# Patient Record
Sex: Male | Born: 1975 | Race: Black or African American | Hispanic: No | Marital: Single | Smoking: Never smoker
Health system: Southern US, Community
[De-identification: ages and names within clinical notes are randomized; demographics above are authoritative.]

## PROBLEM LIST (undated history)

## (undated) DIAGNOSIS — G35 Multiple sclerosis: Secondary | ICD-10-CM

## (undated) DIAGNOSIS — R519 Headache, unspecified: Secondary | ICD-10-CM

## (undated) DIAGNOSIS — N529 Male erectile dysfunction, unspecified: Secondary | ICD-10-CM

## (undated) DIAGNOSIS — E2740 Unspecified adrenocortical insufficiency: Secondary | ICD-10-CM

## (undated) DIAGNOSIS — F32A Depression, unspecified: Secondary | ICD-10-CM

## (undated) DIAGNOSIS — E559 Vitamin D deficiency, unspecified: Secondary | ICD-10-CM

## (undated) HISTORY — DX: Headache, unspecified: R51.9

## (undated) HISTORY — DX: Unspecified adrenocortical insufficiency: E27.40

## (undated) HISTORY — DX: Male erectile dysfunction, unspecified: N52.9

## (undated) HISTORY — DX: Vitamin D deficiency, unspecified: E55.9

## (undated) HISTORY — DX: Depression, unspecified: F32.A

---

## 1997-12-07 ENCOUNTER — Encounter: Admission: RE | Admit: 1997-12-07 | Discharge: 1998-03-07 | Payer: Self-pay | Admitting: Neurology

## 1998-04-03 ENCOUNTER — Inpatient Hospital Stay (HOSPITAL_COMMUNITY): Admission: EM | Admit: 1998-04-03 | Discharge: 1998-04-10 | Payer: Self-pay | Admitting: Emergency Medicine

## 1998-04-16 ENCOUNTER — Encounter: Admission: RE | Admit: 1998-04-16 | Discharge: 1998-07-15 | Payer: Self-pay | Admitting: Pediatrics

## 1999-03-19 ENCOUNTER — Encounter: Admission: RE | Admit: 1999-03-19 | Discharge: 1999-06-17 | Payer: Self-pay

## 1999-09-17 ENCOUNTER — Encounter: Admission: RE | Admit: 1999-09-17 | Discharge: 1999-10-22 | Payer: Self-pay

## 2009-08-15 ENCOUNTER — Ambulatory Visit (HOSPITAL_COMMUNITY): Admission: RE | Admit: 2009-08-15 | Discharge: 2009-08-15 | Payer: Self-pay | Admitting: Psychiatry

## 2010-09-29 ENCOUNTER — Encounter: Payer: Self-pay | Admitting: Otolaryngology

## 2011-12-16 ENCOUNTER — Other Ambulatory Visit: Payer: Self-pay | Admitting: Psychiatry

## 2011-12-16 DIAGNOSIS — G35 Multiple sclerosis: Secondary | ICD-10-CM

## 2011-12-22 ENCOUNTER — Ambulatory Visit
Admission: RE | Admit: 2011-12-22 | Discharge: 2011-12-22 | Disposition: A | Discharge status: Home / Self Care | Payer: Medicare Other | Source: Ambulatory Visit | Attending: Psychiatry | Admitting: Psychiatry

## 2011-12-22 DIAGNOSIS — G35 Multiple sclerosis: Secondary | ICD-10-CM

## 2011-12-22 DIAGNOSIS — G35D Multiple sclerosis, unspecified: Secondary | ICD-10-CM

## 2011-12-22 MED ORDER — GADOBENATE DIMEGLUMINE 529 MG/ML IV SOLN
15.0000 mL | Freq: Once | INTRAVENOUS | Status: AC | PRN
  Administered 2011-12-22: 15 mL via INTRAVENOUS

## 2013-02-28 ENCOUNTER — Other Ambulatory Visit (HOSPITAL_COMMUNITY): Payer: Self-pay | Admitting: Specialist

## 2013-02-28 DIAGNOSIS — G35 Multiple sclerosis: Secondary | ICD-10-CM

## 2013-03-03 ENCOUNTER — Ambulatory Visit (HOSPITAL_COMMUNITY)
Admission: RE | Admit: 2013-03-03 | Discharge: 2013-03-03 | Disposition: A | Discharge status: Home / Self Care | Payer: Medicare Other | Source: Ambulatory Visit | Attending: Specialist | Admitting: Specialist

## 2013-03-03 DIAGNOSIS — G35 Multiple sclerosis: Secondary | ICD-10-CM

## 2013-03-03 MED ORDER — GADOBENATE DIMEGLUMINE 529 MG/ML IV SOLN
15.0000 mL | Freq: Once | INTRAVENOUS | Status: AC
  Administered 2013-03-03: 15 mL via INTRAVENOUS

## 2013-07-20 ENCOUNTER — Other Ambulatory Visit: Payer: Self-pay | Admitting: Gastroenterology

## 2014-10-09 DIAGNOSIS — G35 Multiple sclerosis: Secondary | ICD-10-CM | POA: Diagnosis not present

## 2014-10-10 DIAGNOSIS — G35 Multiple sclerosis: Secondary | ICD-10-CM | POA: Diagnosis not present

## 2014-10-11 DIAGNOSIS — G35 Multiple sclerosis: Secondary | ICD-10-CM | POA: Diagnosis not present

## 2014-10-12 DIAGNOSIS — G35 Multiple sclerosis: Secondary | ICD-10-CM | POA: Diagnosis not present

## 2014-10-13 DIAGNOSIS — M62838 Other muscle spasm: Secondary | ICD-10-CM | POA: Diagnosis not present

## 2014-10-13 DIAGNOSIS — R262 Difficulty in walking, not elsewhere classified: Secondary | ICD-10-CM | POA: Diagnosis not present

## 2014-10-13 DIAGNOSIS — M6281 Muscle weakness (generalized): Secondary | ICD-10-CM | POA: Diagnosis not present

## 2014-10-13 DIAGNOSIS — G35 Multiple sclerosis: Secondary | ICD-10-CM | POA: Diagnosis not present

## 2014-10-14 DIAGNOSIS — G35 Multiple sclerosis: Secondary | ICD-10-CM | POA: Diagnosis not present

## 2014-10-16 DIAGNOSIS — G35 Multiple sclerosis: Secondary | ICD-10-CM | POA: Diagnosis not present

## 2014-10-17 DIAGNOSIS — G35 Multiple sclerosis: Secondary | ICD-10-CM | POA: Diagnosis not present

## 2014-10-18 DIAGNOSIS — G35 Multiple sclerosis: Secondary | ICD-10-CM | POA: Diagnosis not present

## 2014-10-19 DIAGNOSIS — G35 Multiple sclerosis: Secondary | ICD-10-CM | POA: Diagnosis not present

## 2014-10-20 DIAGNOSIS — G35 Multiple sclerosis: Secondary | ICD-10-CM | POA: Diagnosis not present

## 2014-10-21 DIAGNOSIS — G35 Multiple sclerosis: Secondary | ICD-10-CM | POA: Diagnosis not present

## 2014-10-23 DIAGNOSIS — G35 Multiple sclerosis: Secondary | ICD-10-CM | POA: Diagnosis not present

## 2014-10-24 DIAGNOSIS — G35 Multiple sclerosis: Secondary | ICD-10-CM | POA: Diagnosis not present

## 2014-10-25 DIAGNOSIS — G35 Multiple sclerosis: Secondary | ICD-10-CM | POA: Diagnosis not present

## 2014-10-26 DIAGNOSIS — G35 Multiple sclerosis: Secondary | ICD-10-CM | POA: Diagnosis not present

## 2014-10-27 DIAGNOSIS — R262 Difficulty in walking, not elsewhere classified: Secondary | ICD-10-CM | POA: Diagnosis not present

## 2014-10-27 DIAGNOSIS — G35 Multiple sclerosis: Secondary | ICD-10-CM | POA: Diagnosis not present

## 2014-10-27 DIAGNOSIS — M6281 Muscle weakness (generalized): Secondary | ICD-10-CM | POA: Diagnosis not present

## 2014-10-27 DIAGNOSIS — M62838 Other muscle spasm: Secondary | ICD-10-CM | POA: Diagnosis not present

## 2014-10-28 DIAGNOSIS — G35 Multiple sclerosis: Secondary | ICD-10-CM | POA: Diagnosis not present

## 2014-10-30 DIAGNOSIS — G35 Multiple sclerosis: Secondary | ICD-10-CM | POA: Diagnosis not present

## 2014-10-31 DIAGNOSIS — G35 Multiple sclerosis: Secondary | ICD-10-CM | POA: Diagnosis not present

## 2014-11-01 DIAGNOSIS — G35 Multiple sclerosis: Secondary | ICD-10-CM | POA: Diagnosis not present

## 2014-11-02 DIAGNOSIS — G35 Multiple sclerosis: Secondary | ICD-10-CM | POA: Diagnosis not present

## 2014-11-03 DIAGNOSIS — G35 Multiple sclerosis: Secondary | ICD-10-CM | POA: Diagnosis not present

## 2014-11-06 DIAGNOSIS — R5383 Other fatigue: Secondary | ICD-10-CM | POA: Diagnosis not present

## 2014-11-06 DIAGNOSIS — G35 Multiple sclerosis: Secondary | ICD-10-CM | POA: Diagnosis not present

## 2014-11-06 DIAGNOSIS — G959 Disease of spinal cord, unspecified: Secondary | ICD-10-CM | POA: Diagnosis not present

## 2014-11-15 ENCOUNTER — Other Ambulatory Visit: Payer: Self-pay | Admitting: Specialist

## 2014-11-15 DIAGNOSIS — G35 Multiple sclerosis: Secondary | ICD-10-CM | POA: Diagnosis not present

## 2014-11-24 DIAGNOSIS — I10 Essential (primary) hypertension: Secondary | ICD-10-CM | POA: Diagnosis not present

## 2014-11-25 DIAGNOSIS — I10 Essential (primary) hypertension: Secondary | ICD-10-CM | POA: Diagnosis not present

## 2014-11-27 DIAGNOSIS — I10 Essential (primary) hypertension: Secondary | ICD-10-CM | POA: Diagnosis not present

## 2014-11-28 DIAGNOSIS — I10 Essential (primary) hypertension: Secondary | ICD-10-CM | POA: Diagnosis not present

## 2014-11-29 DIAGNOSIS — I10 Essential (primary) hypertension: Secondary | ICD-10-CM | POA: Diagnosis not present

## 2014-11-30 DIAGNOSIS — I10 Essential (primary) hypertension: Secondary | ICD-10-CM | POA: Diagnosis not present

## 2014-12-01 DIAGNOSIS — I10 Essential (primary) hypertension: Secondary | ICD-10-CM | POA: Diagnosis not present

## 2014-12-02 DIAGNOSIS — I10 Essential (primary) hypertension: Secondary | ICD-10-CM | POA: Diagnosis not present

## 2014-12-04 DIAGNOSIS — I10 Essential (primary) hypertension: Secondary | ICD-10-CM | POA: Diagnosis not present

## 2014-12-05 DIAGNOSIS — I10 Essential (primary) hypertension: Secondary | ICD-10-CM | POA: Diagnosis not present

## 2014-12-06 ENCOUNTER — Ambulatory Visit
Admission: RE | Admit: 2014-12-06 | Discharge: 2014-12-06 | Disposition: A | Discharge status: Home / Self Care | Payer: Commercial Managed Care - HMO | Source: Ambulatory Visit | Attending: Specialist | Admitting: Specialist

## 2014-12-06 DIAGNOSIS — M5031 Other cervical disc degeneration,  high cervical region: Secondary | ICD-10-CM | POA: Diagnosis not present

## 2014-12-06 DIAGNOSIS — M4802 Spinal stenosis, cervical region: Secondary | ICD-10-CM | POA: Diagnosis not present

## 2014-12-06 DIAGNOSIS — G35 Multiple sclerosis: Secondary | ICD-10-CM | POA: Diagnosis not present

## 2014-12-06 DIAGNOSIS — I10 Essential (primary) hypertension: Secondary | ICD-10-CM | POA: Diagnosis not present

## 2014-12-06 MED ORDER — GADOBENATE DIMEGLUMINE 529 MG/ML IV SOLN
15.0000 mL | Freq: Once | INTRAVENOUS | Status: AC | PRN
  Administered 2014-12-06: 15 mL via INTRAVENOUS

## 2014-12-07 DIAGNOSIS — I10 Essential (primary) hypertension: Secondary | ICD-10-CM | POA: Diagnosis not present

## 2014-12-08 DIAGNOSIS — I10 Essential (primary) hypertension: Secondary | ICD-10-CM | POA: Diagnosis not present

## 2014-12-09 DIAGNOSIS — I10 Essential (primary) hypertension: Secondary | ICD-10-CM | POA: Diagnosis not present

## 2014-12-11 DIAGNOSIS — I10 Essential (primary) hypertension: Secondary | ICD-10-CM | POA: Diagnosis not present

## 2014-12-12 DIAGNOSIS — I10 Essential (primary) hypertension: Secondary | ICD-10-CM | POA: Diagnosis not present

## 2014-12-13 DIAGNOSIS — I10 Essential (primary) hypertension: Secondary | ICD-10-CM | POA: Diagnosis not present

## 2014-12-14 DIAGNOSIS — I10 Essential (primary) hypertension: Secondary | ICD-10-CM | POA: Diagnosis not present

## 2014-12-15 DIAGNOSIS — I10 Essential (primary) hypertension: Secondary | ICD-10-CM | POA: Diagnosis not present

## 2014-12-16 DIAGNOSIS — I10 Essential (primary) hypertension: Secondary | ICD-10-CM | POA: Diagnosis not present

## 2014-12-18 DIAGNOSIS — I10 Essential (primary) hypertension: Secondary | ICD-10-CM | POA: Diagnosis not present

## 2014-12-19 DIAGNOSIS — I10 Essential (primary) hypertension: Secondary | ICD-10-CM | POA: Diagnosis not present

## 2014-12-20 DIAGNOSIS — I10 Essential (primary) hypertension: Secondary | ICD-10-CM | POA: Diagnosis not present

## 2014-12-21 DIAGNOSIS — I10 Essential (primary) hypertension: Secondary | ICD-10-CM | POA: Diagnosis not present

## 2014-12-22 DIAGNOSIS — I10 Essential (primary) hypertension: Secondary | ICD-10-CM | POA: Diagnosis not present

## 2014-12-23 DIAGNOSIS — I10 Essential (primary) hypertension: Secondary | ICD-10-CM | POA: Diagnosis not present

## 2014-12-25 DIAGNOSIS — I10 Essential (primary) hypertension: Secondary | ICD-10-CM | POA: Diagnosis not present

## 2014-12-26 DIAGNOSIS — I10 Essential (primary) hypertension: Secondary | ICD-10-CM | POA: Diagnosis not present

## 2014-12-27 DIAGNOSIS — I10 Essential (primary) hypertension: Secondary | ICD-10-CM | POA: Diagnosis not present

## 2014-12-28 DIAGNOSIS — I10 Essential (primary) hypertension: Secondary | ICD-10-CM | POA: Diagnosis not present

## 2014-12-29 DIAGNOSIS — I10 Essential (primary) hypertension: Secondary | ICD-10-CM | POA: Diagnosis not present

## 2014-12-30 DIAGNOSIS — I10 Essential (primary) hypertension: Secondary | ICD-10-CM | POA: Diagnosis not present

## 2015-01-01 DIAGNOSIS — I10 Essential (primary) hypertension: Secondary | ICD-10-CM | POA: Diagnosis not present

## 2015-01-02 DIAGNOSIS — I10 Essential (primary) hypertension: Secondary | ICD-10-CM | POA: Diagnosis not present

## 2015-01-03 DIAGNOSIS — I10 Essential (primary) hypertension: Secondary | ICD-10-CM | POA: Diagnosis not present

## 2015-01-04 DIAGNOSIS — I10 Essential (primary) hypertension: Secondary | ICD-10-CM | POA: Diagnosis not present

## 2015-01-05 DIAGNOSIS — I10 Essential (primary) hypertension: Secondary | ICD-10-CM | POA: Diagnosis not present

## 2015-01-06 DIAGNOSIS — I10 Essential (primary) hypertension: Secondary | ICD-10-CM | POA: Diagnosis not present

## 2015-01-08 DIAGNOSIS — I10 Essential (primary) hypertension: Secondary | ICD-10-CM | POA: Diagnosis not present

## 2015-01-09 DIAGNOSIS — I10 Essential (primary) hypertension: Secondary | ICD-10-CM | POA: Diagnosis not present

## 2015-01-10 DIAGNOSIS — I10 Essential (primary) hypertension: Secondary | ICD-10-CM | POA: Diagnosis not present

## 2015-01-11 DIAGNOSIS — I10 Essential (primary) hypertension: Secondary | ICD-10-CM | POA: Diagnosis not present

## 2015-01-12 DIAGNOSIS — R262 Difficulty in walking, not elsewhere classified: Secondary | ICD-10-CM | POA: Diagnosis not present

## 2015-01-12 DIAGNOSIS — I10 Essential (primary) hypertension: Secondary | ICD-10-CM | POA: Diagnosis not present

## 2015-01-12 DIAGNOSIS — M62838 Other muscle spasm: Secondary | ICD-10-CM | POA: Diagnosis not present

## 2015-01-12 DIAGNOSIS — M6281 Muscle weakness (generalized): Secondary | ICD-10-CM | POA: Diagnosis not present

## 2015-01-12 DIAGNOSIS — G35 Multiple sclerosis: Secondary | ICD-10-CM | POA: Diagnosis not present

## 2015-01-13 DIAGNOSIS — I10 Essential (primary) hypertension: Secondary | ICD-10-CM | POA: Diagnosis not present

## 2015-01-16 DIAGNOSIS — I10 Essential (primary) hypertension: Secondary | ICD-10-CM | POA: Diagnosis not present

## 2015-01-17 DIAGNOSIS — I10 Essential (primary) hypertension: Secondary | ICD-10-CM | POA: Diagnosis not present

## 2015-01-18 DIAGNOSIS — I10 Essential (primary) hypertension: Secondary | ICD-10-CM | POA: Diagnosis not present

## 2015-01-19 DIAGNOSIS — G35 Multiple sclerosis: Secondary | ICD-10-CM | POA: Diagnosis not present

## 2015-01-19 DIAGNOSIS — M6281 Muscle weakness (generalized): Secondary | ICD-10-CM | POA: Diagnosis not present

## 2015-01-19 DIAGNOSIS — M62838 Other muscle spasm: Secondary | ICD-10-CM | POA: Diagnosis not present

## 2015-01-19 DIAGNOSIS — I10 Essential (primary) hypertension: Secondary | ICD-10-CM | POA: Diagnosis not present

## 2015-01-19 DIAGNOSIS — R262 Difficulty in walking, not elsewhere classified: Secondary | ICD-10-CM | POA: Diagnosis not present

## 2015-01-20 DIAGNOSIS — I10 Essential (primary) hypertension: Secondary | ICD-10-CM | POA: Diagnosis not present

## 2015-01-22 DIAGNOSIS — I10 Essential (primary) hypertension: Secondary | ICD-10-CM | POA: Diagnosis not present

## 2015-01-23 DIAGNOSIS — I10 Essential (primary) hypertension: Secondary | ICD-10-CM | POA: Diagnosis not present

## 2015-01-24 DIAGNOSIS — I10 Essential (primary) hypertension: Secondary | ICD-10-CM | POA: Diagnosis not present

## 2015-01-25 DIAGNOSIS — I10 Essential (primary) hypertension: Secondary | ICD-10-CM | POA: Diagnosis not present

## 2015-01-26 DIAGNOSIS — G35 Multiple sclerosis: Secondary | ICD-10-CM | POA: Diagnosis not present

## 2015-01-26 DIAGNOSIS — M6281 Muscle weakness (generalized): Secondary | ICD-10-CM | POA: Diagnosis not present

## 2015-01-26 DIAGNOSIS — R262 Difficulty in walking, not elsewhere classified: Secondary | ICD-10-CM | POA: Diagnosis not present

## 2015-01-26 DIAGNOSIS — M62838 Other muscle spasm: Secondary | ICD-10-CM | POA: Diagnosis not present

## 2015-01-26 DIAGNOSIS — I10 Essential (primary) hypertension: Secondary | ICD-10-CM | POA: Diagnosis not present

## 2015-01-27 DIAGNOSIS — I10 Essential (primary) hypertension: Secondary | ICD-10-CM | POA: Diagnosis not present

## 2015-01-29 DIAGNOSIS — I10 Essential (primary) hypertension: Secondary | ICD-10-CM | POA: Diagnosis not present

## 2015-01-30 DIAGNOSIS — I10 Essential (primary) hypertension: Secondary | ICD-10-CM | POA: Diagnosis not present

## 2015-01-31 DIAGNOSIS — I10 Essential (primary) hypertension: Secondary | ICD-10-CM | POA: Diagnosis not present

## 2015-02-01 DIAGNOSIS — I10 Essential (primary) hypertension: Secondary | ICD-10-CM | POA: Diagnosis not present

## 2015-02-02 DIAGNOSIS — I10 Essential (primary) hypertension: Secondary | ICD-10-CM | POA: Diagnosis not present

## 2015-02-03 DIAGNOSIS — I10 Essential (primary) hypertension: Secondary | ICD-10-CM | POA: Diagnosis not present

## 2015-02-05 DIAGNOSIS — I10 Essential (primary) hypertension: Secondary | ICD-10-CM | POA: Diagnosis not present

## 2015-02-06 DIAGNOSIS — G959 Disease of spinal cord, unspecified: Secondary | ICD-10-CM | POA: Diagnosis not present

## 2015-02-06 DIAGNOSIS — D72828 Other elevated white blood cell count: Secondary | ICD-10-CM | POA: Diagnosis not present

## 2015-02-06 DIAGNOSIS — G35 Multiple sclerosis: Secondary | ICD-10-CM | POA: Diagnosis not present

## 2015-02-06 DIAGNOSIS — D899 Disorder involving the immune mechanism, unspecified: Secondary | ICD-10-CM | POA: Diagnosis not present

## 2015-02-06 DIAGNOSIS — I10 Essential (primary) hypertension: Secondary | ICD-10-CM | POA: Diagnosis not present

## 2015-02-06 DIAGNOSIS — R5383 Other fatigue: Secondary | ICD-10-CM | POA: Diagnosis not present

## 2015-02-07 DIAGNOSIS — I10 Essential (primary) hypertension: Secondary | ICD-10-CM | POA: Diagnosis not present

## 2015-02-08 DIAGNOSIS — I10 Essential (primary) hypertension: Secondary | ICD-10-CM | POA: Diagnosis not present

## 2015-02-09 DIAGNOSIS — I10 Essential (primary) hypertension: Secondary | ICD-10-CM | POA: Diagnosis not present

## 2015-02-10 DIAGNOSIS — I10 Essential (primary) hypertension: Secondary | ICD-10-CM | POA: Diagnosis not present

## 2015-02-12 DIAGNOSIS — I10 Essential (primary) hypertension: Secondary | ICD-10-CM | POA: Diagnosis not present

## 2015-02-13 DIAGNOSIS — I10 Essential (primary) hypertension: Secondary | ICD-10-CM | POA: Diagnosis not present

## 2015-02-14 DIAGNOSIS — I10 Essential (primary) hypertension: Secondary | ICD-10-CM | POA: Diagnosis not present

## 2015-02-15 DIAGNOSIS — I10 Essential (primary) hypertension: Secondary | ICD-10-CM | POA: Diagnosis not present

## 2015-02-16 DIAGNOSIS — I10 Essential (primary) hypertension: Secondary | ICD-10-CM | POA: Diagnosis not present

## 2015-02-17 DIAGNOSIS — I10 Essential (primary) hypertension: Secondary | ICD-10-CM | POA: Diagnosis not present

## 2015-02-19 DIAGNOSIS — I10 Essential (primary) hypertension: Secondary | ICD-10-CM | POA: Diagnosis not present

## 2015-02-20 DIAGNOSIS — I10 Essential (primary) hypertension: Secondary | ICD-10-CM | POA: Diagnosis not present

## 2015-02-21 DIAGNOSIS — I10 Essential (primary) hypertension: Secondary | ICD-10-CM | POA: Diagnosis not present

## 2015-02-22 DIAGNOSIS — I10 Essential (primary) hypertension: Secondary | ICD-10-CM | POA: Diagnosis not present

## 2015-02-23 DIAGNOSIS — I10 Essential (primary) hypertension: Secondary | ICD-10-CM | POA: Diagnosis not present

## 2015-02-24 DIAGNOSIS — I10 Essential (primary) hypertension: Secondary | ICD-10-CM | POA: Diagnosis not present

## 2015-02-26 DIAGNOSIS — I10 Essential (primary) hypertension: Secondary | ICD-10-CM | POA: Diagnosis not present

## 2015-02-27 DIAGNOSIS — I10 Essential (primary) hypertension: Secondary | ICD-10-CM | POA: Diagnosis not present

## 2015-02-28 DIAGNOSIS — I10 Essential (primary) hypertension: Secondary | ICD-10-CM | POA: Diagnosis not present

## 2015-03-01 DIAGNOSIS — I10 Essential (primary) hypertension: Secondary | ICD-10-CM | POA: Diagnosis not present

## 2015-03-02 DIAGNOSIS — I10 Essential (primary) hypertension: Secondary | ICD-10-CM | POA: Diagnosis not present

## 2015-03-03 DIAGNOSIS — I10 Essential (primary) hypertension: Secondary | ICD-10-CM | POA: Diagnosis not present

## 2015-03-05 DIAGNOSIS — I10 Essential (primary) hypertension: Secondary | ICD-10-CM | POA: Diagnosis not present

## 2015-03-06 DIAGNOSIS — I10 Essential (primary) hypertension: Secondary | ICD-10-CM | POA: Diagnosis not present

## 2015-03-07 DIAGNOSIS — I10 Essential (primary) hypertension: Secondary | ICD-10-CM | POA: Diagnosis not present

## 2015-03-08 DIAGNOSIS — I10 Essential (primary) hypertension: Secondary | ICD-10-CM | POA: Diagnosis not present

## 2015-03-09 DIAGNOSIS — I10 Essential (primary) hypertension: Secondary | ICD-10-CM | POA: Diagnosis not present

## 2015-03-10 DIAGNOSIS — I10 Essential (primary) hypertension: Secondary | ICD-10-CM | POA: Diagnosis not present

## 2015-03-12 DIAGNOSIS — I10 Essential (primary) hypertension: Secondary | ICD-10-CM | POA: Diagnosis not present

## 2015-03-13 DIAGNOSIS — I10 Essential (primary) hypertension: Secondary | ICD-10-CM | POA: Diagnosis not present

## 2015-03-14 DIAGNOSIS — I10 Essential (primary) hypertension: Secondary | ICD-10-CM | POA: Diagnosis not present

## 2015-03-15 DIAGNOSIS — I10 Essential (primary) hypertension: Secondary | ICD-10-CM | POA: Diagnosis not present

## 2015-03-16 DIAGNOSIS — I10 Essential (primary) hypertension: Secondary | ICD-10-CM | POA: Diagnosis not present

## 2015-03-17 DIAGNOSIS — I10 Essential (primary) hypertension: Secondary | ICD-10-CM | POA: Diagnosis not present

## 2015-03-19 DIAGNOSIS — I10 Essential (primary) hypertension: Secondary | ICD-10-CM | POA: Diagnosis not present

## 2015-03-20 DIAGNOSIS — I10 Essential (primary) hypertension: Secondary | ICD-10-CM | POA: Diagnosis not present

## 2015-03-20 DIAGNOSIS — G35 Multiple sclerosis: Secondary | ICD-10-CM | POA: Diagnosis not present

## 2015-03-21 DIAGNOSIS — G35 Multiple sclerosis: Secondary | ICD-10-CM | POA: Diagnosis not present

## 2015-03-21 DIAGNOSIS — I10 Essential (primary) hypertension: Secondary | ICD-10-CM | POA: Diagnosis not present

## 2015-03-22 DIAGNOSIS — G35 Multiple sclerosis: Secondary | ICD-10-CM | POA: Diagnosis not present

## 2015-03-22 DIAGNOSIS — I10 Essential (primary) hypertension: Secondary | ICD-10-CM | POA: Diagnosis not present

## 2015-03-23 DIAGNOSIS — G35 Multiple sclerosis: Secondary | ICD-10-CM | POA: Diagnosis not present

## 2015-03-23 DIAGNOSIS — I10 Essential (primary) hypertension: Secondary | ICD-10-CM | POA: Diagnosis not present

## 2015-03-24 DIAGNOSIS — I10 Essential (primary) hypertension: Secondary | ICD-10-CM | POA: Diagnosis not present

## 2015-03-24 DIAGNOSIS — G35 Multiple sclerosis: Secondary | ICD-10-CM | POA: Diagnosis not present

## 2015-03-26 DIAGNOSIS — G35 Multiple sclerosis: Secondary | ICD-10-CM | POA: Diagnosis not present

## 2015-03-26 DIAGNOSIS — I10 Essential (primary) hypertension: Secondary | ICD-10-CM | POA: Diagnosis not present

## 2015-03-27 DIAGNOSIS — I10 Essential (primary) hypertension: Secondary | ICD-10-CM | POA: Diagnosis not present

## 2015-03-27 DIAGNOSIS — G35 Multiple sclerosis: Secondary | ICD-10-CM | POA: Diagnosis not present

## 2015-03-28 DIAGNOSIS — I10 Essential (primary) hypertension: Secondary | ICD-10-CM | POA: Diagnosis not present

## 2015-03-28 DIAGNOSIS — G35 Multiple sclerosis: Secondary | ICD-10-CM | POA: Diagnosis not present

## 2015-03-29 DIAGNOSIS — G35 Multiple sclerosis: Secondary | ICD-10-CM | POA: Diagnosis not present

## 2015-03-29 DIAGNOSIS — I10 Essential (primary) hypertension: Secondary | ICD-10-CM | POA: Diagnosis not present

## 2015-03-30 DIAGNOSIS — G35 Multiple sclerosis: Secondary | ICD-10-CM | POA: Diagnosis not present

## 2015-03-30 DIAGNOSIS — I10 Essential (primary) hypertension: Secondary | ICD-10-CM | POA: Diagnosis not present

## 2015-03-31 DIAGNOSIS — I10 Essential (primary) hypertension: Secondary | ICD-10-CM | POA: Diagnosis not present

## 2015-03-31 DIAGNOSIS — G35 Multiple sclerosis: Secondary | ICD-10-CM | POA: Diagnosis not present

## 2015-04-02 DIAGNOSIS — I10 Essential (primary) hypertension: Secondary | ICD-10-CM | POA: Diagnosis not present

## 2015-04-02 DIAGNOSIS — G35 Multiple sclerosis: Secondary | ICD-10-CM | POA: Diagnosis not present

## 2015-04-03 DIAGNOSIS — I10 Essential (primary) hypertension: Secondary | ICD-10-CM | POA: Diagnosis not present

## 2015-04-03 DIAGNOSIS — G35 Multiple sclerosis: Secondary | ICD-10-CM | POA: Diagnosis not present

## 2015-04-04 DIAGNOSIS — G35 Multiple sclerosis: Secondary | ICD-10-CM | POA: Diagnosis not present

## 2015-04-04 DIAGNOSIS — I10 Essential (primary) hypertension: Secondary | ICD-10-CM | POA: Diagnosis not present

## 2015-04-05 DIAGNOSIS — G35 Multiple sclerosis: Secondary | ICD-10-CM | POA: Diagnosis not present

## 2015-04-05 DIAGNOSIS — I10 Essential (primary) hypertension: Secondary | ICD-10-CM | POA: Diagnosis not present

## 2015-04-06 DIAGNOSIS — I10 Essential (primary) hypertension: Secondary | ICD-10-CM | POA: Diagnosis not present

## 2015-04-06 DIAGNOSIS — G35 Multiple sclerosis: Secondary | ICD-10-CM | POA: Diagnosis not present

## 2015-04-07 DIAGNOSIS — G35 Multiple sclerosis: Secondary | ICD-10-CM | POA: Diagnosis not present

## 2015-04-07 DIAGNOSIS — I10 Essential (primary) hypertension: Secondary | ICD-10-CM | POA: Diagnosis not present

## 2015-04-09 DIAGNOSIS — G35 Multiple sclerosis: Secondary | ICD-10-CM | POA: Diagnosis not present

## 2015-04-09 DIAGNOSIS — I10 Essential (primary) hypertension: Secondary | ICD-10-CM | POA: Diagnosis not present

## 2015-04-10 DIAGNOSIS — I10 Essential (primary) hypertension: Secondary | ICD-10-CM | POA: Diagnosis not present

## 2015-04-10 DIAGNOSIS — G35 Multiple sclerosis: Secondary | ICD-10-CM | POA: Diagnosis not present

## 2015-04-11 DIAGNOSIS — I10 Essential (primary) hypertension: Secondary | ICD-10-CM | POA: Diagnosis not present

## 2015-04-11 DIAGNOSIS — G35 Multiple sclerosis: Secondary | ICD-10-CM | POA: Diagnosis not present

## 2015-04-12 DIAGNOSIS — I10 Essential (primary) hypertension: Secondary | ICD-10-CM | POA: Diagnosis not present

## 2015-04-12 DIAGNOSIS — G35 Multiple sclerosis: Secondary | ICD-10-CM | POA: Diagnosis not present

## 2015-04-13 DIAGNOSIS — I10 Essential (primary) hypertension: Secondary | ICD-10-CM | POA: Diagnosis not present

## 2015-04-13 DIAGNOSIS — G35 Multiple sclerosis: Secondary | ICD-10-CM | POA: Diagnosis not present

## 2015-04-14 DIAGNOSIS — G35 Multiple sclerosis: Secondary | ICD-10-CM | POA: Diagnosis not present

## 2015-04-14 DIAGNOSIS — I10 Essential (primary) hypertension: Secondary | ICD-10-CM | POA: Diagnosis not present

## 2015-04-16 DIAGNOSIS — G35 Multiple sclerosis: Secondary | ICD-10-CM | POA: Diagnosis not present

## 2015-04-16 DIAGNOSIS — I10 Essential (primary) hypertension: Secondary | ICD-10-CM | POA: Diagnosis not present

## 2015-04-17 DIAGNOSIS — G35 Multiple sclerosis: Secondary | ICD-10-CM | POA: Diagnosis not present

## 2015-04-17 DIAGNOSIS — I10 Essential (primary) hypertension: Secondary | ICD-10-CM | POA: Diagnosis not present

## 2015-04-18 DIAGNOSIS — G35 Multiple sclerosis: Secondary | ICD-10-CM | POA: Diagnosis not present

## 2015-04-18 DIAGNOSIS — I10 Essential (primary) hypertension: Secondary | ICD-10-CM | POA: Diagnosis not present

## 2015-04-19 DIAGNOSIS — G35 Multiple sclerosis: Secondary | ICD-10-CM | POA: Diagnosis not present

## 2015-04-19 DIAGNOSIS — I10 Essential (primary) hypertension: Secondary | ICD-10-CM | POA: Diagnosis not present

## 2015-04-20 DIAGNOSIS — I10 Essential (primary) hypertension: Secondary | ICD-10-CM | POA: Diagnosis not present

## 2015-04-20 DIAGNOSIS — G35 Multiple sclerosis: Secondary | ICD-10-CM | POA: Diagnosis not present

## 2015-04-21 DIAGNOSIS — G35 Multiple sclerosis: Secondary | ICD-10-CM | POA: Diagnosis not present

## 2015-04-21 DIAGNOSIS — I10 Essential (primary) hypertension: Secondary | ICD-10-CM | POA: Diagnosis not present

## 2015-04-23 DIAGNOSIS — G35 Multiple sclerosis: Secondary | ICD-10-CM | POA: Diagnosis not present

## 2015-04-23 DIAGNOSIS — I10 Essential (primary) hypertension: Secondary | ICD-10-CM | POA: Diagnosis not present

## 2015-04-24 DIAGNOSIS — I10 Essential (primary) hypertension: Secondary | ICD-10-CM | POA: Diagnosis not present

## 2015-04-24 DIAGNOSIS — G35 Multiple sclerosis: Secondary | ICD-10-CM | POA: Diagnosis not present

## 2015-04-25 DIAGNOSIS — I10 Essential (primary) hypertension: Secondary | ICD-10-CM | POA: Diagnosis not present

## 2015-04-25 DIAGNOSIS — G35 Multiple sclerosis: Secondary | ICD-10-CM | POA: Diagnosis not present

## 2015-04-26 DIAGNOSIS — G35 Multiple sclerosis: Secondary | ICD-10-CM | POA: Diagnosis not present

## 2015-04-26 DIAGNOSIS — I10 Essential (primary) hypertension: Secondary | ICD-10-CM | POA: Diagnosis not present

## 2015-04-27 DIAGNOSIS — G35 Multiple sclerosis: Secondary | ICD-10-CM | POA: Diagnosis not present

## 2015-04-27 DIAGNOSIS — I10 Essential (primary) hypertension: Secondary | ICD-10-CM | POA: Diagnosis not present

## 2015-04-28 DIAGNOSIS — G35 Multiple sclerosis: Secondary | ICD-10-CM | POA: Diagnosis not present

## 2015-04-28 DIAGNOSIS — I10 Essential (primary) hypertension: Secondary | ICD-10-CM | POA: Diagnosis not present

## 2015-04-30 DIAGNOSIS — I10 Essential (primary) hypertension: Secondary | ICD-10-CM | POA: Diagnosis not present

## 2015-04-30 DIAGNOSIS — G35 Multiple sclerosis: Secondary | ICD-10-CM | POA: Diagnosis not present

## 2015-05-01 DIAGNOSIS — G35 Multiple sclerosis: Secondary | ICD-10-CM | POA: Diagnosis not present

## 2015-05-01 DIAGNOSIS — I10 Essential (primary) hypertension: Secondary | ICD-10-CM | POA: Diagnosis not present

## 2015-05-02 DIAGNOSIS — I10 Essential (primary) hypertension: Secondary | ICD-10-CM | POA: Diagnosis not present

## 2015-05-02 DIAGNOSIS — G35 Multiple sclerosis: Secondary | ICD-10-CM | POA: Diagnosis not present

## 2015-05-03 DIAGNOSIS — G35 Multiple sclerosis: Secondary | ICD-10-CM | POA: Diagnosis not present

## 2015-05-03 DIAGNOSIS — I10 Essential (primary) hypertension: Secondary | ICD-10-CM | POA: Diagnosis not present

## 2015-05-04 DIAGNOSIS — I10 Essential (primary) hypertension: Secondary | ICD-10-CM | POA: Diagnosis not present

## 2015-05-04 DIAGNOSIS — G35 Multiple sclerosis: Secondary | ICD-10-CM | POA: Diagnosis not present

## 2015-05-09 DIAGNOSIS — G35 Multiple sclerosis: Secondary | ICD-10-CM | POA: Diagnosis not present

## 2015-05-09 DIAGNOSIS — G4730 Sleep apnea, unspecified: Secondary | ICD-10-CM | POA: Diagnosis not present

## 2015-05-09 DIAGNOSIS — G959 Disease of spinal cord, unspecified: Secondary | ICD-10-CM | POA: Diagnosis not present

## 2015-05-09 DIAGNOSIS — R5383 Other fatigue: Secondary | ICD-10-CM | POA: Diagnosis not present

## 2015-05-09 DIAGNOSIS — Z79899 Other long term (current) drug therapy: Secondary | ICD-10-CM | POA: Diagnosis not present

## 2015-05-09 DIAGNOSIS — D72828 Other elevated white blood cell count: Secondary | ICD-10-CM | POA: Diagnosis not present

## 2015-05-09 DIAGNOSIS — D899 Disorder involving the immune mechanism, unspecified: Secondary | ICD-10-CM | POA: Diagnosis not present

## 2015-05-10 DIAGNOSIS — G35 Multiple sclerosis: Secondary | ICD-10-CM | POA: Diagnosis not present

## 2015-05-10 DIAGNOSIS — I10 Essential (primary) hypertension: Secondary | ICD-10-CM | POA: Diagnosis not present

## 2015-05-11 DIAGNOSIS — I10 Essential (primary) hypertension: Secondary | ICD-10-CM | POA: Diagnosis not present

## 2015-05-11 DIAGNOSIS — G35 Multiple sclerosis: Secondary | ICD-10-CM | POA: Diagnosis not present

## 2015-05-12 DIAGNOSIS — G35 Multiple sclerosis: Secondary | ICD-10-CM | POA: Diagnosis not present

## 2015-05-12 DIAGNOSIS — I10 Essential (primary) hypertension: Secondary | ICD-10-CM | POA: Diagnosis not present

## 2015-05-14 DIAGNOSIS — I10 Essential (primary) hypertension: Secondary | ICD-10-CM | POA: Diagnosis not present

## 2015-05-14 DIAGNOSIS — G35 Multiple sclerosis: Secondary | ICD-10-CM | POA: Diagnosis not present

## 2015-05-15 DIAGNOSIS — G35 Multiple sclerosis: Secondary | ICD-10-CM | POA: Diagnosis not present

## 2015-05-15 DIAGNOSIS — I10 Essential (primary) hypertension: Secondary | ICD-10-CM | POA: Diagnosis not present

## 2015-05-16 DIAGNOSIS — G35 Multiple sclerosis: Secondary | ICD-10-CM | POA: Diagnosis not present

## 2015-05-16 DIAGNOSIS — I10 Essential (primary) hypertension: Secondary | ICD-10-CM | POA: Diagnosis not present

## 2015-05-17 DIAGNOSIS — G35 Multiple sclerosis: Secondary | ICD-10-CM | POA: Diagnosis not present

## 2015-05-17 DIAGNOSIS — I10 Essential (primary) hypertension: Secondary | ICD-10-CM | POA: Diagnosis not present

## 2015-05-18 DIAGNOSIS — G35 Multiple sclerosis: Secondary | ICD-10-CM | POA: Diagnosis not present

## 2015-05-18 DIAGNOSIS — I10 Essential (primary) hypertension: Secondary | ICD-10-CM | POA: Diagnosis not present

## 2015-05-19 DIAGNOSIS — I10 Essential (primary) hypertension: Secondary | ICD-10-CM | POA: Diagnosis not present

## 2015-05-19 DIAGNOSIS — G35 Multiple sclerosis: Secondary | ICD-10-CM | POA: Diagnosis not present

## 2015-05-21 DIAGNOSIS — I10 Essential (primary) hypertension: Secondary | ICD-10-CM | POA: Diagnosis not present

## 2015-05-21 DIAGNOSIS — G35 Multiple sclerosis: Secondary | ICD-10-CM | POA: Diagnosis not present

## 2015-05-22 DIAGNOSIS — G35 Multiple sclerosis: Secondary | ICD-10-CM | POA: Diagnosis not present

## 2015-05-22 DIAGNOSIS — I10 Essential (primary) hypertension: Secondary | ICD-10-CM | POA: Diagnosis not present

## 2015-05-23 DIAGNOSIS — I10 Essential (primary) hypertension: Secondary | ICD-10-CM | POA: Diagnosis not present

## 2015-05-23 DIAGNOSIS — G35 Multiple sclerosis: Secondary | ICD-10-CM | POA: Diagnosis not present

## 2015-05-24 DIAGNOSIS — I10 Essential (primary) hypertension: Secondary | ICD-10-CM | POA: Diagnosis not present

## 2015-05-24 DIAGNOSIS — G35 Multiple sclerosis: Secondary | ICD-10-CM | POA: Diagnosis not present

## 2015-05-25 DIAGNOSIS — G4730 Sleep apnea, unspecified: Secondary | ICD-10-CM | POA: Diagnosis not present

## 2015-05-25 DIAGNOSIS — G35 Multiple sclerosis: Secondary | ICD-10-CM | POA: Diagnosis not present

## 2015-05-25 DIAGNOSIS — I10 Essential (primary) hypertension: Secondary | ICD-10-CM | POA: Diagnosis not present

## 2015-05-26 DIAGNOSIS — G35 Multiple sclerosis: Secondary | ICD-10-CM | POA: Diagnosis not present

## 2015-05-26 DIAGNOSIS — I10 Essential (primary) hypertension: Secondary | ICD-10-CM | POA: Diagnosis not present

## 2015-05-28 DIAGNOSIS — I10 Essential (primary) hypertension: Secondary | ICD-10-CM | POA: Diagnosis not present

## 2015-05-28 DIAGNOSIS — L0889 Other specified local infections of the skin and subcutaneous tissue: Secondary | ICD-10-CM | POA: Diagnosis not present

## 2015-05-28 DIAGNOSIS — R0781 Pleurodynia: Secondary | ICD-10-CM | POA: Diagnosis not present

## 2015-05-28 DIAGNOSIS — G35 Multiple sclerosis: Secondary | ICD-10-CM | POA: Diagnosis not present

## 2015-05-28 DIAGNOSIS — Z681 Body mass index (BMI) 19 or less, adult: Secondary | ICD-10-CM | POA: Diagnosis not present

## 2015-05-28 DIAGNOSIS — K5900 Constipation, unspecified: Secondary | ICD-10-CM | POA: Diagnosis not present

## 2015-05-29 DIAGNOSIS — G35 Multiple sclerosis: Secondary | ICD-10-CM | POA: Diagnosis not present

## 2015-05-29 DIAGNOSIS — H00022 Hordeolum internum right lower eyelid: Secondary | ICD-10-CM | POA: Diagnosis not present

## 2015-05-29 DIAGNOSIS — I10 Essential (primary) hypertension: Secondary | ICD-10-CM | POA: Diagnosis not present

## 2015-05-30 DIAGNOSIS — G35 Multiple sclerosis: Secondary | ICD-10-CM | POA: Diagnosis not present

## 2015-05-30 DIAGNOSIS — H0012 Chalazion right lower eyelid: Secondary | ICD-10-CM | POA: Diagnosis not present

## 2015-05-30 DIAGNOSIS — I10 Essential (primary) hypertension: Secondary | ICD-10-CM | POA: Diagnosis not present

## 2015-05-31 DIAGNOSIS — G35 Multiple sclerosis: Secondary | ICD-10-CM | POA: Diagnosis not present

## 2015-05-31 DIAGNOSIS — I10 Essential (primary) hypertension: Secondary | ICD-10-CM | POA: Diagnosis not present

## 2015-06-01 DIAGNOSIS — G35 Multiple sclerosis: Secondary | ICD-10-CM | POA: Diagnosis not present

## 2015-06-01 DIAGNOSIS — I10 Essential (primary) hypertension: Secondary | ICD-10-CM | POA: Diagnosis not present

## 2015-06-02 DIAGNOSIS — I10 Essential (primary) hypertension: Secondary | ICD-10-CM | POA: Diagnosis not present

## 2015-06-02 DIAGNOSIS — G35 Multiple sclerosis: Secondary | ICD-10-CM | POA: Diagnosis not present

## 2015-06-11 DIAGNOSIS — G35 Multiple sclerosis: Secondary | ICD-10-CM | POA: Diagnosis not present

## 2015-06-11 DIAGNOSIS — I10 Essential (primary) hypertension: Secondary | ICD-10-CM | POA: Diagnosis not present

## 2015-06-12 DIAGNOSIS — I10 Essential (primary) hypertension: Secondary | ICD-10-CM | POA: Diagnosis not present

## 2015-06-12 DIAGNOSIS — G35 Multiple sclerosis: Secondary | ICD-10-CM | POA: Diagnosis not present

## 2015-06-13 DIAGNOSIS — G35 Multiple sclerosis: Secondary | ICD-10-CM | POA: Diagnosis not present

## 2015-06-13 DIAGNOSIS — I10 Essential (primary) hypertension: Secondary | ICD-10-CM | POA: Diagnosis not present

## 2015-06-14 DIAGNOSIS — I10 Essential (primary) hypertension: Secondary | ICD-10-CM | POA: Diagnosis not present

## 2015-06-14 DIAGNOSIS — G35 Multiple sclerosis: Secondary | ICD-10-CM | POA: Diagnosis not present

## 2015-06-15 DIAGNOSIS — G35 Multiple sclerosis: Secondary | ICD-10-CM | POA: Diagnosis not present

## 2015-06-15 DIAGNOSIS — G4730 Sleep apnea, unspecified: Secondary | ICD-10-CM | POA: Diagnosis not present

## 2015-06-15 DIAGNOSIS — I10 Essential (primary) hypertension: Secondary | ICD-10-CM | POA: Diagnosis not present

## 2015-06-16 DIAGNOSIS — G35 Multiple sclerosis: Secondary | ICD-10-CM | POA: Diagnosis not present

## 2015-06-16 DIAGNOSIS — I10 Essential (primary) hypertension: Secondary | ICD-10-CM | POA: Diagnosis not present

## 2015-06-18 DIAGNOSIS — G35 Multiple sclerosis: Secondary | ICD-10-CM | POA: Diagnosis not present

## 2015-06-18 DIAGNOSIS — I10 Essential (primary) hypertension: Secondary | ICD-10-CM | POA: Diagnosis not present

## 2015-06-19 DIAGNOSIS — G35 Multiple sclerosis: Secondary | ICD-10-CM | POA: Diagnosis not present

## 2015-06-19 DIAGNOSIS — I10 Essential (primary) hypertension: Secondary | ICD-10-CM | POA: Diagnosis not present

## 2015-06-20 DIAGNOSIS — G35 Multiple sclerosis: Secondary | ICD-10-CM | POA: Diagnosis not present

## 2015-06-20 DIAGNOSIS — I10 Essential (primary) hypertension: Secondary | ICD-10-CM | POA: Diagnosis not present

## 2015-06-21 DIAGNOSIS — I10 Essential (primary) hypertension: Secondary | ICD-10-CM | POA: Diagnosis not present

## 2015-06-21 DIAGNOSIS — G35 Multiple sclerosis: Secondary | ICD-10-CM | POA: Diagnosis not present

## 2015-06-22 DIAGNOSIS — G35 Multiple sclerosis: Secondary | ICD-10-CM | POA: Diagnosis not present

## 2015-06-22 DIAGNOSIS — I10 Essential (primary) hypertension: Secondary | ICD-10-CM | POA: Diagnosis not present

## 2015-06-23 DIAGNOSIS — I10 Essential (primary) hypertension: Secondary | ICD-10-CM | POA: Diagnosis not present

## 2015-06-23 DIAGNOSIS — G35 Multiple sclerosis: Secondary | ICD-10-CM | POA: Diagnosis not present

## 2015-06-25 DIAGNOSIS — G35 Multiple sclerosis: Secondary | ICD-10-CM | POA: Diagnosis not present

## 2015-06-25 DIAGNOSIS — I10 Essential (primary) hypertension: Secondary | ICD-10-CM | POA: Diagnosis not present

## 2015-06-26 DIAGNOSIS — I10 Essential (primary) hypertension: Secondary | ICD-10-CM | POA: Diagnosis not present

## 2015-06-26 DIAGNOSIS — G35 Multiple sclerosis: Secondary | ICD-10-CM | POA: Diagnosis not present

## 2015-06-27 DIAGNOSIS — G35 Multiple sclerosis: Secondary | ICD-10-CM | POA: Diagnosis not present

## 2015-06-27 DIAGNOSIS — I10 Essential (primary) hypertension: Secondary | ICD-10-CM | POA: Diagnosis not present

## 2015-06-28 DIAGNOSIS — G35 Multiple sclerosis: Secondary | ICD-10-CM | POA: Diagnosis not present

## 2015-06-28 DIAGNOSIS — I10 Essential (primary) hypertension: Secondary | ICD-10-CM | POA: Diagnosis not present

## 2015-06-29 DIAGNOSIS — G35 Multiple sclerosis: Secondary | ICD-10-CM | POA: Diagnosis not present

## 2015-06-29 DIAGNOSIS — I10 Essential (primary) hypertension: Secondary | ICD-10-CM | POA: Diagnosis not present

## 2015-06-30 DIAGNOSIS — I10 Essential (primary) hypertension: Secondary | ICD-10-CM | POA: Diagnosis not present

## 2015-06-30 DIAGNOSIS — G35 Multiple sclerosis: Secondary | ICD-10-CM | POA: Diagnosis not present

## 2015-07-10 DIAGNOSIS — I10 Essential (primary) hypertension: Secondary | ICD-10-CM | POA: Diagnosis not present

## 2015-07-10 DIAGNOSIS — G35 Multiple sclerosis: Secondary | ICD-10-CM | POA: Diagnosis not present

## 2015-07-11 DIAGNOSIS — I10 Essential (primary) hypertension: Secondary | ICD-10-CM | POA: Diagnosis not present

## 2015-07-11 DIAGNOSIS — G35 Multiple sclerosis: Secondary | ICD-10-CM | POA: Diagnosis not present

## 2015-07-12 DIAGNOSIS — I10 Essential (primary) hypertension: Secondary | ICD-10-CM | POA: Diagnosis not present

## 2015-07-12 DIAGNOSIS — G35 Multiple sclerosis: Secondary | ICD-10-CM | POA: Diagnosis not present

## 2015-07-13 DIAGNOSIS — G35 Multiple sclerosis: Secondary | ICD-10-CM | POA: Diagnosis not present

## 2015-07-13 DIAGNOSIS — I10 Essential (primary) hypertension: Secondary | ICD-10-CM | POA: Diagnosis not present

## 2015-07-14 DIAGNOSIS — I10 Essential (primary) hypertension: Secondary | ICD-10-CM | POA: Diagnosis not present

## 2015-07-14 DIAGNOSIS — G35 Multiple sclerosis: Secondary | ICD-10-CM | POA: Diagnosis not present

## 2015-07-16 DIAGNOSIS — I10 Essential (primary) hypertension: Secondary | ICD-10-CM | POA: Diagnosis not present

## 2015-07-16 DIAGNOSIS — G35 Multiple sclerosis: Secondary | ICD-10-CM | POA: Diagnosis not present

## 2015-07-17 DIAGNOSIS — I10 Essential (primary) hypertension: Secondary | ICD-10-CM | POA: Diagnosis not present

## 2015-07-17 DIAGNOSIS — G35 Multiple sclerosis: Secondary | ICD-10-CM | POA: Diagnosis not present

## 2015-07-18 DIAGNOSIS — I10 Essential (primary) hypertension: Secondary | ICD-10-CM | POA: Diagnosis not present

## 2015-07-18 DIAGNOSIS — G35 Multiple sclerosis: Secondary | ICD-10-CM | POA: Diagnosis not present

## 2015-07-19 DIAGNOSIS — G35 Multiple sclerosis: Secondary | ICD-10-CM | POA: Diagnosis not present

## 2015-07-19 DIAGNOSIS — I10 Essential (primary) hypertension: Secondary | ICD-10-CM | POA: Diagnosis not present

## 2015-07-20 DIAGNOSIS — G4733 Obstructive sleep apnea (adult) (pediatric): Secondary | ICD-10-CM | POA: Diagnosis not present

## 2015-07-20 DIAGNOSIS — I10 Essential (primary) hypertension: Secondary | ICD-10-CM | POA: Diagnosis not present

## 2015-07-20 DIAGNOSIS — G35 Multiple sclerosis: Secondary | ICD-10-CM | POA: Diagnosis not present

## 2015-07-21 DIAGNOSIS — I10 Essential (primary) hypertension: Secondary | ICD-10-CM | POA: Diagnosis not present

## 2015-07-21 DIAGNOSIS — G35 Multiple sclerosis: Secondary | ICD-10-CM | POA: Diagnosis not present

## 2015-07-23 DIAGNOSIS — I10 Essential (primary) hypertension: Secondary | ICD-10-CM | POA: Diagnosis not present

## 2015-07-23 DIAGNOSIS — G35 Multiple sclerosis: Secondary | ICD-10-CM | POA: Diagnosis not present

## 2015-07-24 DIAGNOSIS — G35 Multiple sclerosis: Secondary | ICD-10-CM | POA: Diagnosis not present

## 2015-07-24 DIAGNOSIS — I10 Essential (primary) hypertension: Secondary | ICD-10-CM | POA: Diagnosis not present

## 2015-07-25 DIAGNOSIS — I10 Essential (primary) hypertension: Secondary | ICD-10-CM | POA: Diagnosis not present

## 2015-07-25 DIAGNOSIS — G35 Multiple sclerosis: Secondary | ICD-10-CM | POA: Diagnosis not present

## 2015-07-26 DIAGNOSIS — G35 Multiple sclerosis: Secondary | ICD-10-CM | POA: Diagnosis not present

## 2015-07-26 DIAGNOSIS — I10 Essential (primary) hypertension: Secondary | ICD-10-CM | POA: Diagnosis not present

## 2015-07-27 DIAGNOSIS — I10 Essential (primary) hypertension: Secondary | ICD-10-CM | POA: Diagnosis not present

## 2015-07-27 DIAGNOSIS — G35 Multiple sclerosis: Secondary | ICD-10-CM | POA: Diagnosis not present

## 2015-07-28 DIAGNOSIS — G35 Multiple sclerosis: Secondary | ICD-10-CM | POA: Diagnosis not present

## 2015-07-28 DIAGNOSIS — I10 Essential (primary) hypertension: Secondary | ICD-10-CM | POA: Diagnosis not present

## 2015-07-30 DIAGNOSIS — G35 Multiple sclerosis: Secondary | ICD-10-CM | POA: Diagnosis not present

## 2015-07-30 DIAGNOSIS — I10 Essential (primary) hypertension: Secondary | ICD-10-CM | POA: Diagnosis not present

## 2015-07-31 DIAGNOSIS — I10 Essential (primary) hypertension: Secondary | ICD-10-CM | POA: Diagnosis not present

## 2015-07-31 DIAGNOSIS — G35 Multiple sclerosis: Secondary | ICD-10-CM | POA: Diagnosis not present

## 2015-08-01 DIAGNOSIS — G35 Multiple sclerosis: Secondary | ICD-10-CM | POA: Diagnosis not present

## 2015-08-01 DIAGNOSIS — I10 Essential (primary) hypertension: Secondary | ICD-10-CM | POA: Diagnosis not present

## 2015-08-02 DIAGNOSIS — I10 Essential (primary) hypertension: Secondary | ICD-10-CM | POA: Diagnosis not present

## 2015-08-02 DIAGNOSIS — G35 Multiple sclerosis: Secondary | ICD-10-CM | POA: Diagnosis not present

## 2015-08-03 DIAGNOSIS — I10 Essential (primary) hypertension: Secondary | ICD-10-CM | POA: Diagnosis not present

## 2015-08-03 DIAGNOSIS — G35 Multiple sclerosis: Secondary | ICD-10-CM | POA: Diagnosis not present

## 2015-08-04 DIAGNOSIS — G35 Multiple sclerosis: Secondary | ICD-10-CM | POA: Diagnosis not present

## 2015-08-04 DIAGNOSIS — I10 Essential (primary) hypertension: Secondary | ICD-10-CM | POA: Diagnosis not present

## 2015-08-09 DIAGNOSIS — G35 Multiple sclerosis: Secondary | ICD-10-CM | POA: Diagnosis not present

## 2015-08-09 DIAGNOSIS — I10 Essential (primary) hypertension: Secondary | ICD-10-CM | POA: Diagnosis not present

## 2015-08-10 DIAGNOSIS — G35 Multiple sclerosis: Secondary | ICD-10-CM | POA: Diagnosis not present

## 2015-08-10 DIAGNOSIS — I10 Essential (primary) hypertension: Secondary | ICD-10-CM | POA: Diagnosis not present

## 2015-08-11 DIAGNOSIS — I10 Essential (primary) hypertension: Secondary | ICD-10-CM | POA: Diagnosis not present

## 2015-08-11 DIAGNOSIS — G35 Multiple sclerosis: Secondary | ICD-10-CM | POA: Diagnosis not present

## 2015-08-13 DIAGNOSIS — I10 Essential (primary) hypertension: Secondary | ICD-10-CM | POA: Diagnosis not present

## 2015-08-13 DIAGNOSIS — G35 Multiple sclerosis: Secondary | ICD-10-CM | POA: Diagnosis not present

## 2015-08-14 DIAGNOSIS — G35 Multiple sclerosis: Secondary | ICD-10-CM | POA: Diagnosis not present

## 2015-08-14 DIAGNOSIS — I10 Essential (primary) hypertension: Secondary | ICD-10-CM | POA: Diagnosis not present

## 2015-08-15 DIAGNOSIS — R5383 Other fatigue: Secondary | ICD-10-CM | POA: Diagnosis not present

## 2015-08-15 DIAGNOSIS — G959 Disease of spinal cord, unspecified: Secondary | ICD-10-CM | POA: Diagnosis not present

## 2015-08-15 DIAGNOSIS — D899 Disorder involving the immune mechanism, unspecified: Secondary | ICD-10-CM | POA: Diagnosis not present

## 2015-08-15 DIAGNOSIS — Z79899 Other long term (current) drug therapy: Secondary | ICD-10-CM | POA: Diagnosis not present

## 2015-08-15 DIAGNOSIS — I10 Essential (primary) hypertension: Secondary | ICD-10-CM | POA: Diagnosis not present

## 2015-08-15 DIAGNOSIS — G4733 Obstructive sleep apnea (adult) (pediatric): Secondary | ICD-10-CM | POA: Diagnosis not present

## 2015-08-15 DIAGNOSIS — G35 Multiple sclerosis: Secondary | ICD-10-CM | POA: Diagnosis not present

## 2015-08-16 DIAGNOSIS — I10 Essential (primary) hypertension: Secondary | ICD-10-CM | POA: Diagnosis not present

## 2015-08-16 DIAGNOSIS — G35 Multiple sclerosis: Secondary | ICD-10-CM | POA: Diagnosis not present

## 2015-08-17 DIAGNOSIS — G35 Multiple sclerosis: Secondary | ICD-10-CM | POA: Diagnosis not present

## 2015-08-17 DIAGNOSIS — I10 Essential (primary) hypertension: Secondary | ICD-10-CM | POA: Diagnosis not present

## 2015-08-18 DIAGNOSIS — G35 Multiple sclerosis: Secondary | ICD-10-CM | POA: Diagnosis not present

## 2015-08-18 DIAGNOSIS — I10 Essential (primary) hypertension: Secondary | ICD-10-CM | POA: Diagnosis not present

## 2015-08-19 DIAGNOSIS — G35 Multiple sclerosis: Secondary | ICD-10-CM | POA: Diagnosis not present

## 2015-08-19 DIAGNOSIS — G4733 Obstructive sleep apnea (adult) (pediatric): Secondary | ICD-10-CM | POA: Diagnosis not present

## 2015-08-20 DIAGNOSIS — I10 Essential (primary) hypertension: Secondary | ICD-10-CM | POA: Diagnosis not present

## 2015-08-20 DIAGNOSIS — G35 Multiple sclerosis: Secondary | ICD-10-CM | POA: Diagnosis not present

## 2015-08-21 DIAGNOSIS — G35 Multiple sclerosis: Secondary | ICD-10-CM | POA: Diagnosis not present

## 2015-08-21 DIAGNOSIS — I10 Essential (primary) hypertension: Secondary | ICD-10-CM | POA: Diagnosis not present

## 2015-08-22 DIAGNOSIS — I10 Essential (primary) hypertension: Secondary | ICD-10-CM | POA: Diagnosis not present

## 2015-08-22 DIAGNOSIS — G35 Multiple sclerosis: Secondary | ICD-10-CM | POA: Diagnosis not present

## 2015-08-23 DIAGNOSIS — I10 Essential (primary) hypertension: Secondary | ICD-10-CM | POA: Diagnosis not present

## 2015-08-23 DIAGNOSIS — G35 Multiple sclerosis: Secondary | ICD-10-CM | POA: Diagnosis not present

## 2015-08-24 DIAGNOSIS — G35 Multiple sclerosis: Secondary | ICD-10-CM | POA: Diagnosis not present

## 2015-08-24 DIAGNOSIS — I10 Essential (primary) hypertension: Secondary | ICD-10-CM | POA: Diagnosis not present

## 2015-08-25 DIAGNOSIS — G35 Multiple sclerosis: Secondary | ICD-10-CM | POA: Diagnosis not present

## 2015-08-25 DIAGNOSIS — I10 Essential (primary) hypertension: Secondary | ICD-10-CM | POA: Diagnosis not present

## 2015-08-27 DIAGNOSIS — G4733 Obstructive sleep apnea (adult) (pediatric): Secondary | ICD-10-CM | POA: Diagnosis not present

## 2015-08-27 DIAGNOSIS — I10 Essential (primary) hypertension: Secondary | ICD-10-CM | POA: Diagnosis not present

## 2015-08-27 DIAGNOSIS — Z6820 Body mass index (BMI) 20.0-20.9, adult: Secondary | ICD-10-CM | POA: Diagnosis not present

## 2015-08-27 DIAGNOSIS — Z1389 Encounter for screening for other disorder: Secondary | ICD-10-CM | POA: Diagnosis not present

## 2015-08-27 DIAGNOSIS — G35 Multiple sclerosis: Secondary | ICD-10-CM | POA: Diagnosis not present

## 2015-08-27 DIAGNOSIS — N529 Male erectile dysfunction, unspecified: Secondary | ICD-10-CM | POA: Diagnosis not present

## 2015-08-27 DIAGNOSIS — R350 Frequency of micturition: Secondary | ICD-10-CM | POA: Diagnosis not present

## 2015-08-28 DIAGNOSIS — G35 Multiple sclerosis: Secondary | ICD-10-CM | POA: Diagnosis not present

## 2015-08-28 DIAGNOSIS — I10 Essential (primary) hypertension: Secondary | ICD-10-CM | POA: Diagnosis not present

## 2015-08-29 DIAGNOSIS — I10 Essential (primary) hypertension: Secondary | ICD-10-CM | POA: Diagnosis not present

## 2015-08-29 DIAGNOSIS — G35 Multiple sclerosis: Secondary | ICD-10-CM | POA: Diagnosis not present

## 2015-08-30 DIAGNOSIS — G35 Multiple sclerosis: Secondary | ICD-10-CM | POA: Diagnosis not present

## 2015-08-30 DIAGNOSIS — I10 Essential (primary) hypertension: Secondary | ICD-10-CM | POA: Diagnosis not present

## 2015-08-31 DIAGNOSIS — I10 Essential (primary) hypertension: Secondary | ICD-10-CM | POA: Diagnosis not present

## 2015-08-31 DIAGNOSIS — G35 Multiple sclerosis: Secondary | ICD-10-CM | POA: Diagnosis not present

## 2015-09-01 DIAGNOSIS — I10 Essential (primary) hypertension: Secondary | ICD-10-CM | POA: Diagnosis not present

## 2015-09-01 DIAGNOSIS — G35 Multiple sclerosis: Secondary | ICD-10-CM | POA: Diagnosis not present

## 2015-09-03 DIAGNOSIS — G35 Multiple sclerosis: Secondary | ICD-10-CM | POA: Diagnosis not present

## 2015-09-03 DIAGNOSIS — I10 Essential (primary) hypertension: Secondary | ICD-10-CM | POA: Diagnosis not present

## 2015-09-04 DIAGNOSIS — G35 Multiple sclerosis: Secondary | ICD-10-CM | POA: Diagnosis not present

## 2015-09-04 DIAGNOSIS — I10 Essential (primary) hypertension: Secondary | ICD-10-CM | POA: Diagnosis not present

## 2015-09-05 DIAGNOSIS — I10 Essential (primary) hypertension: Secondary | ICD-10-CM | POA: Diagnosis not present

## 2015-09-05 DIAGNOSIS — G35 Multiple sclerosis: Secondary | ICD-10-CM | POA: Diagnosis not present

## 2015-09-06 DIAGNOSIS — I10 Essential (primary) hypertension: Secondary | ICD-10-CM | POA: Diagnosis not present

## 2015-09-06 DIAGNOSIS — G35 Multiple sclerosis: Secondary | ICD-10-CM | POA: Diagnosis not present

## 2015-09-07 DIAGNOSIS — G35 Multiple sclerosis: Secondary | ICD-10-CM | POA: Diagnosis not present

## 2015-09-07 DIAGNOSIS — I10 Essential (primary) hypertension: Secondary | ICD-10-CM | POA: Diagnosis not present

## 2015-09-08 DIAGNOSIS — I10 Essential (primary) hypertension: Secondary | ICD-10-CM | POA: Diagnosis not present

## 2015-09-08 DIAGNOSIS — G35 Multiple sclerosis: Secondary | ICD-10-CM | POA: Diagnosis not present

## 2015-09-10 DIAGNOSIS — G35 Multiple sclerosis: Secondary | ICD-10-CM | POA: Diagnosis not present

## 2015-09-10 DIAGNOSIS — I10 Essential (primary) hypertension: Secondary | ICD-10-CM | POA: Diagnosis not present

## 2015-09-11 DIAGNOSIS — I10 Essential (primary) hypertension: Secondary | ICD-10-CM | POA: Diagnosis not present

## 2015-09-11 DIAGNOSIS — G35 Multiple sclerosis: Secondary | ICD-10-CM | POA: Diagnosis not present

## 2015-09-12 DIAGNOSIS — I10 Essential (primary) hypertension: Secondary | ICD-10-CM | POA: Diagnosis not present

## 2015-09-12 DIAGNOSIS — G35 Multiple sclerosis: Secondary | ICD-10-CM | POA: Diagnosis not present

## 2015-09-13 DIAGNOSIS — G35 Multiple sclerosis: Secondary | ICD-10-CM | POA: Diagnosis not present

## 2015-09-13 DIAGNOSIS — I10 Essential (primary) hypertension: Secondary | ICD-10-CM | POA: Diagnosis not present

## 2015-09-14 DIAGNOSIS — I10 Essential (primary) hypertension: Secondary | ICD-10-CM | POA: Diagnosis not present

## 2015-09-14 DIAGNOSIS — G35 Multiple sclerosis: Secondary | ICD-10-CM | POA: Diagnosis not present

## 2015-09-15 DIAGNOSIS — I10 Essential (primary) hypertension: Secondary | ICD-10-CM | POA: Diagnosis not present

## 2015-09-15 DIAGNOSIS — G35 Multiple sclerosis: Secondary | ICD-10-CM | POA: Diagnosis not present

## 2015-09-17 DIAGNOSIS — G35 Multiple sclerosis: Secondary | ICD-10-CM | POA: Diagnosis not present

## 2015-09-17 DIAGNOSIS — I10 Essential (primary) hypertension: Secondary | ICD-10-CM | POA: Diagnosis not present

## 2015-09-18 DIAGNOSIS — G35 Multiple sclerosis: Secondary | ICD-10-CM | POA: Diagnosis not present

## 2015-09-18 DIAGNOSIS — I10 Essential (primary) hypertension: Secondary | ICD-10-CM | POA: Diagnosis not present

## 2015-09-19 DIAGNOSIS — I10 Essential (primary) hypertension: Secondary | ICD-10-CM | POA: Diagnosis not present

## 2015-09-19 DIAGNOSIS — G35 Multiple sclerosis: Secondary | ICD-10-CM | POA: Diagnosis not present

## 2015-09-19 DIAGNOSIS — G4733 Obstructive sleep apnea (adult) (pediatric): Secondary | ICD-10-CM | POA: Diagnosis not present

## 2015-09-20 DIAGNOSIS — I10 Essential (primary) hypertension: Secondary | ICD-10-CM | POA: Diagnosis not present

## 2015-09-20 DIAGNOSIS — G35 Multiple sclerosis: Secondary | ICD-10-CM | POA: Diagnosis not present

## 2015-09-21 DIAGNOSIS — I10 Essential (primary) hypertension: Secondary | ICD-10-CM | POA: Diagnosis not present

## 2015-09-21 DIAGNOSIS — G35 Multiple sclerosis: Secondary | ICD-10-CM | POA: Diagnosis not present

## 2015-09-22 DIAGNOSIS — G35 Multiple sclerosis: Secondary | ICD-10-CM | POA: Diagnosis not present

## 2015-09-22 DIAGNOSIS — I10 Essential (primary) hypertension: Secondary | ICD-10-CM | POA: Diagnosis not present

## 2015-09-24 DIAGNOSIS — G35 Multiple sclerosis: Secondary | ICD-10-CM | POA: Diagnosis not present

## 2015-09-24 DIAGNOSIS — I10 Essential (primary) hypertension: Secondary | ICD-10-CM | POA: Diagnosis not present

## 2015-09-25 DIAGNOSIS — G35 Multiple sclerosis: Secondary | ICD-10-CM | POA: Diagnosis not present

## 2015-09-25 DIAGNOSIS — I10 Essential (primary) hypertension: Secondary | ICD-10-CM | POA: Diagnosis not present

## 2015-09-26 DIAGNOSIS — I10 Essential (primary) hypertension: Secondary | ICD-10-CM | POA: Diagnosis not present

## 2015-09-26 DIAGNOSIS — G35 Multiple sclerosis: Secondary | ICD-10-CM | POA: Diagnosis not present

## 2015-09-27 DIAGNOSIS — I10 Essential (primary) hypertension: Secondary | ICD-10-CM | POA: Diagnosis not present

## 2015-09-27 DIAGNOSIS — G35 Multiple sclerosis: Secondary | ICD-10-CM | POA: Diagnosis not present

## 2015-09-28 DIAGNOSIS — G35 Multiple sclerosis: Secondary | ICD-10-CM | POA: Diagnosis not present

## 2015-09-28 DIAGNOSIS — I10 Essential (primary) hypertension: Secondary | ICD-10-CM | POA: Diagnosis not present

## 2015-09-29 DIAGNOSIS — I10 Essential (primary) hypertension: Secondary | ICD-10-CM | POA: Diagnosis not present

## 2015-09-29 DIAGNOSIS — G35 Multiple sclerosis: Secondary | ICD-10-CM | POA: Diagnosis not present

## 2015-10-01 DIAGNOSIS — G35 Multiple sclerosis: Secondary | ICD-10-CM | POA: Diagnosis not present

## 2015-10-01 DIAGNOSIS — I10 Essential (primary) hypertension: Secondary | ICD-10-CM | POA: Diagnosis not present

## 2015-10-02 DIAGNOSIS — I10 Essential (primary) hypertension: Secondary | ICD-10-CM | POA: Diagnosis not present

## 2015-10-02 DIAGNOSIS — G35 Multiple sclerosis: Secondary | ICD-10-CM | POA: Diagnosis not present

## 2015-10-03 DIAGNOSIS — I10 Essential (primary) hypertension: Secondary | ICD-10-CM | POA: Diagnosis not present

## 2015-10-03 DIAGNOSIS — G35 Multiple sclerosis: Secondary | ICD-10-CM | POA: Diagnosis not present

## 2015-10-09 DIAGNOSIS — I10 Essential (primary) hypertension: Secondary | ICD-10-CM | POA: Diagnosis not present

## 2015-10-10 DIAGNOSIS — I10 Essential (primary) hypertension: Secondary | ICD-10-CM | POA: Diagnosis not present

## 2015-10-11 DIAGNOSIS — I10 Essential (primary) hypertension: Secondary | ICD-10-CM | POA: Diagnosis not present

## 2015-10-12 DIAGNOSIS — I10 Essential (primary) hypertension: Secondary | ICD-10-CM | POA: Diagnosis not present

## 2015-10-13 DIAGNOSIS — I10 Essential (primary) hypertension: Secondary | ICD-10-CM | POA: Diagnosis not present

## 2015-10-15 DIAGNOSIS — I10 Essential (primary) hypertension: Secondary | ICD-10-CM | POA: Diagnosis not present

## 2015-10-16 DIAGNOSIS — I10 Essential (primary) hypertension: Secondary | ICD-10-CM | POA: Diagnosis not present

## 2015-10-17 DIAGNOSIS — I10 Essential (primary) hypertension: Secondary | ICD-10-CM | POA: Diagnosis not present

## 2015-10-18 DIAGNOSIS — I10 Essential (primary) hypertension: Secondary | ICD-10-CM | POA: Diagnosis not present

## 2015-10-19 DIAGNOSIS — I10 Essential (primary) hypertension: Secondary | ICD-10-CM | POA: Diagnosis not present

## 2015-10-20 DIAGNOSIS — I10 Essential (primary) hypertension: Secondary | ICD-10-CM | POA: Diagnosis not present

## 2015-10-20 DIAGNOSIS — G4733 Obstructive sleep apnea (adult) (pediatric): Secondary | ICD-10-CM | POA: Diagnosis not present

## 2015-10-20 DIAGNOSIS — G35 Multiple sclerosis: Secondary | ICD-10-CM | POA: Diagnosis not present

## 2015-10-22 DIAGNOSIS — I10 Essential (primary) hypertension: Secondary | ICD-10-CM | POA: Diagnosis not present

## 2015-10-23 DIAGNOSIS — I10 Essential (primary) hypertension: Secondary | ICD-10-CM | POA: Diagnosis not present

## 2015-10-24 DIAGNOSIS — I10 Essential (primary) hypertension: Secondary | ICD-10-CM | POA: Diagnosis not present

## 2015-10-25 DIAGNOSIS — I10 Essential (primary) hypertension: Secondary | ICD-10-CM | POA: Diagnosis not present

## 2015-10-26 DIAGNOSIS — I10 Essential (primary) hypertension: Secondary | ICD-10-CM | POA: Diagnosis not present

## 2015-10-27 DIAGNOSIS — I10 Essential (primary) hypertension: Secondary | ICD-10-CM | POA: Diagnosis not present

## 2015-10-29 DIAGNOSIS — I10 Essential (primary) hypertension: Secondary | ICD-10-CM | POA: Diagnosis not present

## 2015-10-30 DIAGNOSIS — I10 Essential (primary) hypertension: Secondary | ICD-10-CM | POA: Diagnosis not present

## 2015-10-31 DIAGNOSIS — I10 Essential (primary) hypertension: Secondary | ICD-10-CM | POA: Diagnosis not present

## 2015-11-01 DIAGNOSIS — I10 Essential (primary) hypertension: Secondary | ICD-10-CM | POA: Diagnosis not present

## 2015-11-02 DIAGNOSIS — I10 Essential (primary) hypertension: Secondary | ICD-10-CM | POA: Diagnosis not present

## 2015-11-03 DIAGNOSIS — I10 Essential (primary) hypertension: Secondary | ICD-10-CM | POA: Diagnosis not present

## 2015-11-12 DIAGNOSIS — I10 Essential (primary) hypertension: Secondary | ICD-10-CM | POA: Diagnosis not present

## 2015-11-13 DIAGNOSIS — I10 Essential (primary) hypertension: Secondary | ICD-10-CM | POA: Diagnosis not present

## 2015-11-14 DIAGNOSIS — G35 Multiple sclerosis: Secondary | ICD-10-CM | POA: Diagnosis not present

## 2015-11-14 DIAGNOSIS — R5383 Other fatigue: Secondary | ICD-10-CM | POA: Diagnosis not present

## 2015-11-14 DIAGNOSIS — G959 Disease of spinal cord, unspecified: Secondary | ICD-10-CM | POA: Diagnosis not present

## 2015-11-14 DIAGNOSIS — D72828 Other elevated white blood cell count: Secondary | ICD-10-CM | POA: Diagnosis not present

## 2015-11-14 DIAGNOSIS — Z79899 Other long term (current) drug therapy: Secondary | ICD-10-CM | POA: Diagnosis not present

## 2015-11-14 DIAGNOSIS — I10 Essential (primary) hypertension: Secondary | ICD-10-CM | POA: Diagnosis not present

## 2015-11-14 DIAGNOSIS — D899 Disorder involving the immune mechanism, unspecified: Secondary | ICD-10-CM | POA: Diagnosis not present

## 2015-11-15 DIAGNOSIS — I10 Essential (primary) hypertension: Secondary | ICD-10-CM | POA: Diagnosis not present

## 2015-11-16 DIAGNOSIS — I10 Essential (primary) hypertension: Secondary | ICD-10-CM | POA: Diagnosis not present

## 2015-11-17 DIAGNOSIS — I10 Essential (primary) hypertension: Secondary | ICD-10-CM | POA: Diagnosis not present

## 2015-11-17 DIAGNOSIS — G4733 Obstructive sleep apnea (adult) (pediatric): Secondary | ICD-10-CM | POA: Diagnosis not present

## 2015-11-17 DIAGNOSIS — G35 Multiple sclerosis: Secondary | ICD-10-CM | POA: Diagnosis not present

## 2015-11-19 DIAGNOSIS — I10 Essential (primary) hypertension: Secondary | ICD-10-CM | POA: Diagnosis not present

## 2015-11-20 DIAGNOSIS — I10 Essential (primary) hypertension: Secondary | ICD-10-CM | POA: Diagnosis not present

## 2015-11-21 DIAGNOSIS — I10 Essential (primary) hypertension: Secondary | ICD-10-CM | POA: Diagnosis not present

## 2015-11-22 DIAGNOSIS — I10 Essential (primary) hypertension: Secondary | ICD-10-CM | POA: Diagnosis not present

## 2015-11-23 DIAGNOSIS — I10 Essential (primary) hypertension: Secondary | ICD-10-CM | POA: Diagnosis not present

## 2015-11-24 DIAGNOSIS — I10 Essential (primary) hypertension: Secondary | ICD-10-CM | POA: Diagnosis not present

## 2015-11-26 DIAGNOSIS — I10 Essential (primary) hypertension: Secondary | ICD-10-CM | POA: Diagnosis not present

## 2015-11-27 DIAGNOSIS — I10 Essential (primary) hypertension: Secondary | ICD-10-CM | POA: Diagnosis not present

## 2015-11-28 DIAGNOSIS — H527 Unspecified disorder of refraction: Secondary | ICD-10-CM | POA: Diagnosis not present

## 2015-11-28 DIAGNOSIS — H47293 Other optic atrophy, bilateral: Secondary | ICD-10-CM | POA: Diagnosis not present

## 2015-11-28 DIAGNOSIS — H43392 Other vitreous opacities, left eye: Secondary | ICD-10-CM | POA: Diagnosis not present

## 2015-11-28 DIAGNOSIS — I10 Essential (primary) hypertension: Secondary | ICD-10-CM | POA: Diagnosis not present

## 2015-11-28 DIAGNOSIS — Z0100 Encounter for examination of eyes and vision without abnormal findings: Secondary | ICD-10-CM | POA: Diagnosis not present

## 2015-11-29 DIAGNOSIS — I10 Essential (primary) hypertension: Secondary | ICD-10-CM | POA: Diagnosis not present

## 2015-11-30 DIAGNOSIS — Z880 Allergy status to penicillin: Secondary | ICD-10-CM | POA: Diagnosis not present

## 2015-11-30 DIAGNOSIS — H53412 Scotoma involving central area, left eye: Secondary | ICD-10-CM | POA: Diagnosis not present

## 2015-11-30 DIAGNOSIS — Z79899 Other long term (current) drug therapy: Secondary | ICD-10-CM | POA: Diagnosis not present

## 2015-11-30 DIAGNOSIS — I10 Essential (primary) hypertension: Secondary | ICD-10-CM | POA: Diagnosis not present

## 2015-11-30 DIAGNOSIS — G35 Multiple sclerosis: Secondary | ICD-10-CM | POA: Diagnosis not present

## 2015-11-30 DIAGNOSIS — H4720 Unspecified optic atrophy: Secondary | ICD-10-CM | POA: Diagnosis not present

## 2015-11-30 DIAGNOSIS — H3589 Other specified retinal disorders: Secondary | ICD-10-CM | POA: Diagnosis not present

## 2015-12-01 DIAGNOSIS — I10 Essential (primary) hypertension: Secondary | ICD-10-CM | POA: Diagnosis not present

## 2015-12-03 DIAGNOSIS — I10 Essential (primary) hypertension: Secondary | ICD-10-CM | POA: Diagnosis not present

## 2015-12-04 DIAGNOSIS — I10 Essential (primary) hypertension: Secondary | ICD-10-CM | POA: Diagnosis not present

## 2015-12-05 DIAGNOSIS — I10 Essential (primary) hypertension: Secondary | ICD-10-CM | POA: Diagnosis not present

## 2015-12-06 DIAGNOSIS — I10 Essential (primary) hypertension: Secondary | ICD-10-CM | POA: Diagnosis not present

## 2015-12-07 DIAGNOSIS — I10 Essential (primary) hypertension: Secondary | ICD-10-CM | POA: Diagnosis not present

## 2015-12-08 DIAGNOSIS — I10 Essential (primary) hypertension: Secondary | ICD-10-CM | POA: Diagnosis not present

## 2015-12-10 DIAGNOSIS — I10 Essential (primary) hypertension: Secondary | ICD-10-CM | POA: Diagnosis not present

## 2015-12-11 DIAGNOSIS — I10 Essential (primary) hypertension: Secondary | ICD-10-CM | POA: Diagnosis not present

## 2015-12-12 DIAGNOSIS — I10 Essential (primary) hypertension: Secondary | ICD-10-CM | POA: Diagnosis not present

## 2015-12-13 DIAGNOSIS — I10 Essential (primary) hypertension: Secondary | ICD-10-CM | POA: Diagnosis not present

## 2015-12-14 DIAGNOSIS — I10 Essential (primary) hypertension: Secondary | ICD-10-CM | POA: Diagnosis not present

## 2015-12-15 DIAGNOSIS — I10 Essential (primary) hypertension: Secondary | ICD-10-CM | POA: Diagnosis not present

## 2015-12-17 DIAGNOSIS — I10 Essential (primary) hypertension: Secondary | ICD-10-CM | POA: Diagnosis not present

## 2015-12-18 DIAGNOSIS — G35 Multiple sclerosis: Secondary | ICD-10-CM | POA: Diagnosis not present

## 2015-12-18 DIAGNOSIS — I10 Essential (primary) hypertension: Secondary | ICD-10-CM | POA: Diagnosis not present

## 2015-12-18 DIAGNOSIS — G4733 Obstructive sleep apnea (adult) (pediatric): Secondary | ICD-10-CM | POA: Diagnosis not present

## 2015-12-19 DIAGNOSIS — I10 Essential (primary) hypertension: Secondary | ICD-10-CM | POA: Diagnosis not present

## 2015-12-20 DIAGNOSIS — I10 Essential (primary) hypertension: Secondary | ICD-10-CM | POA: Diagnosis not present

## 2015-12-21 DIAGNOSIS — I10 Essential (primary) hypertension: Secondary | ICD-10-CM | POA: Diagnosis not present

## 2015-12-22 DIAGNOSIS — I10 Essential (primary) hypertension: Secondary | ICD-10-CM | POA: Diagnosis not present

## 2015-12-24 DIAGNOSIS — I10 Essential (primary) hypertension: Secondary | ICD-10-CM | POA: Diagnosis not present

## 2015-12-25 DIAGNOSIS — I10 Essential (primary) hypertension: Secondary | ICD-10-CM | POA: Diagnosis not present

## 2015-12-26 DIAGNOSIS — I10 Essential (primary) hypertension: Secondary | ICD-10-CM | POA: Diagnosis not present

## 2015-12-27 DIAGNOSIS — I10 Essential (primary) hypertension: Secondary | ICD-10-CM | POA: Diagnosis not present

## 2015-12-28 DIAGNOSIS — I10 Essential (primary) hypertension: Secondary | ICD-10-CM | POA: Diagnosis not present

## 2015-12-29 DIAGNOSIS — I10 Essential (primary) hypertension: Secondary | ICD-10-CM | POA: Diagnosis not present

## 2015-12-31 DIAGNOSIS — I10 Essential (primary) hypertension: Secondary | ICD-10-CM | POA: Diagnosis not present

## 2016-01-01 DIAGNOSIS — I10 Essential (primary) hypertension: Secondary | ICD-10-CM | POA: Diagnosis not present

## 2016-01-02 DIAGNOSIS — I10 Essential (primary) hypertension: Secondary | ICD-10-CM | POA: Diagnosis not present

## 2016-01-03 DIAGNOSIS — I10 Essential (primary) hypertension: Secondary | ICD-10-CM | POA: Diagnosis not present

## 2016-01-04 DIAGNOSIS — I10 Essential (primary) hypertension: Secondary | ICD-10-CM | POA: Diagnosis not present

## 2016-01-05 DIAGNOSIS — I10 Essential (primary) hypertension: Secondary | ICD-10-CM | POA: Diagnosis not present

## 2016-01-07 DIAGNOSIS — I10 Essential (primary) hypertension: Secondary | ICD-10-CM | POA: Diagnosis not present

## 2016-01-08 DIAGNOSIS — I10 Essential (primary) hypertension: Secondary | ICD-10-CM | POA: Diagnosis not present

## 2016-01-09 DIAGNOSIS — I10 Essential (primary) hypertension: Secondary | ICD-10-CM | POA: Diagnosis not present

## 2016-01-10 DIAGNOSIS — I10 Essential (primary) hypertension: Secondary | ICD-10-CM | POA: Diagnosis not present

## 2016-01-11 DIAGNOSIS — I10 Essential (primary) hypertension: Secondary | ICD-10-CM | POA: Diagnosis not present

## 2016-01-12 DIAGNOSIS — I10 Essential (primary) hypertension: Secondary | ICD-10-CM | POA: Diagnosis not present

## 2016-01-14 DIAGNOSIS — I10 Essential (primary) hypertension: Secondary | ICD-10-CM | POA: Diagnosis not present

## 2016-01-15 DIAGNOSIS — I10 Essential (primary) hypertension: Secondary | ICD-10-CM | POA: Diagnosis not present

## 2016-01-16 DIAGNOSIS — I10 Essential (primary) hypertension: Secondary | ICD-10-CM | POA: Diagnosis not present

## 2016-01-17 DIAGNOSIS — G4733 Obstructive sleep apnea (adult) (pediatric): Secondary | ICD-10-CM | POA: Diagnosis not present

## 2016-01-17 DIAGNOSIS — G35 Multiple sclerosis: Secondary | ICD-10-CM | POA: Diagnosis not present

## 2016-01-17 DIAGNOSIS — I10 Essential (primary) hypertension: Secondary | ICD-10-CM | POA: Diagnosis not present

## 2016-01-18 DIAGNOSIS — I10 Essential (primary) hypertension: Secondary | ICD-10-CM | POA: Diagnosis not present

## 2016-01-18 DIAGNOSIS — G35 Multiple sclerosis: Secondary | ICD-10-CM | POA: Diagnosis not present

## 2016-01-18 DIAGNOSIS — G4733 Obstructive sleep apnea (adult) (pediatric): Secondary | ICD-10-CM | POA: Diagnosis not present

## 2016-01-19 DIAGNOSIS — I10 Essential (primary) hypertension: Secondary | ICD-10-CM | POA: Diagnosis not present

## 2016-01-21 DIAGNOSIS — I10 Essential (primary) hypertension: Secondary | ICD-10-CM | POA: Diagnosis not present

## 2016-01-22 DIAGNOSIS — I10 Essential (primary) hypertension: Secondary | ICD-10-CM | POA: Diagnosis not present

## 2016-01-23 DIAGNOSIS — I10 Essential (primary) hypertension: Secondary | ICD-10-CM | POA: Diagnosis not present

## 2016-01-24 DIAGNOSIS — I10 Essential (primary) hypertension: Secondary | ICD-10-CM | POA: Diagnosis not present

## 2016-01-25 DIAGNOSIS — M549 Dorsalgia, unspecified: Secondary | ICD-10-CM | POA: Diagnosis not present

## 2016-01-25 DIAGNOSIS — N3942 Incontinence without sensory awareness: Secondary | ICD-10-CM | POA: Diagnosis not present

## 2016-01-25 DIAGNOSIS — Z6820 Body mass index (BMI) 20.0-20.9, adult: Secondary | ICD-10-CM | POA: Diagnosis not present

## 2016-01-25 DIAGNOSIS — R350 Frequency of micturition: Secondary | ICD-10-CM | POA: Diagnosis not present

## 2016-01-25 DIAGNOSIS — R51 Headache: Secondary | ICD-10-CM | POA: Diagnosis not present

## 2016-01-25 DIAGNOSIS — E559 Vitamin D deficiency, unspecified: Secondary | ICD-10-CM | POA: Diagnosis not present

## 2016-01-25 DIAGNOSIS — I10 Essential (primary) hypertension: Secondary | ICD-10-CM | POA: Diagnosis not present

## 2016-01-25 DIAGNOSIS — L989 Disorder of the skin and subcutaneous tissue, unspecified: Secondary | ICD-10-CM | POA: Diagnosis not present

## 2016-01-25 DIAGNOSIS — G35 Multiple sclerosis: Secondary | ICD-10-CM | POA: Diagnosis not present

## 2016-01-26 DIAGNOSIS — I10 Essential (primary) hypertension: Secondary | ICD-10-CM | POA: Diagnosis not present

## 2016-01-28 DIAGNOSIS — I10 Essential (primary) hypertension: Secondary | ICD-10-CM | POA: Diagnosis not present

## 2016-01-29 DIAGNOSIS — I10 Essential (primary) hypertension: Secondary | ICD-10-CM | POA: Diagnosis not present

## 2016-01-30 DIAGNOSIS — I10 Essential (primary) hypertension: Secondary | ICD-10-CM | POA: Diagnosis not present

## 2016-01-31 DIAGNOSIS — I10 Essential (primary) hypertension: Secondary | ICD-10-CM | POA: Diagnosis not present

## 2016-02-01 DIAGNOSIS — I10 Essential (primary) hypertension: Secondary | ICD-10-CM | POA: Diagnosis not present

## 2016-02-02 DIAGNOSIS — I10 Essential (primary) hypertension: Secondary | ICD-10-CM | POA: Diagnosis not present

## 2016-02-04 DIAGNOSIS — I10 Essential (primary) hypertension: Secondary | ICD-10-CM | POA: Diagnosis not present

## 2016-02-05 DIAGNOSIS — I10 Essential (primary) hypertension: Secondary | ICD-10-CM | POA: Diagnosis not present

## 2016-02-06 DIAGNOSIS — I10 Essential (primary) hypertension: Secondary | ICD-10-CM | POA: Diagnosis not present

## 2016-02-07 DIAGNOSIS — I10 Essential (primary) hypertension: Secondary | ICD-10-CM | POA: Diagnosis not present

## 2016-02-08 DIAGNOSIS — I10 Essential (primary) hypertension: Secondary | ICD-10-CM | POA: Diagnosis not present

## 2016-02-09 DIAGNOSIS — I10 Essential (primary) hypertension: Secondary | ICD-10-CM | POA: Diagnosis not present

## 2016-02-11 DIAGNOSIS — I10 Essential (primary) hypertension: Secondary | ICD-10-CM | POA: Diagnosis not present

## 2016-02-12 DIAGNOSIS — K5909 Other constipation: Secondary | ICD-10-CM | POA: Diagnosis not present

## 2016-02-12 DIAGNOSIS — N3942 Incontinence without sensory awareness: Secondary | ICD-10-CM | POA: Diagnosis not present

## 2016-02-12 DIAGNOSIS — M545 Low back pain: Secondary | ICD-10-CM | POA: Diagnosis not present

## 2016-02-12 DIAGNOSIS — E784 Other hyperlipidemia: Secondary | ICD-10-CM | POA: Diagnosis not present

## 2016-02-12 DIAGNOSIS — L89159 Pressure ulcer of sacral region, unspecified stage: Secondary | ICD-10-CM | POA: Diagnosis not present

## 2016-02-12 DIAGNOSIS — G4733 Obstructive sleep apnea (adult) (pediatric): Secondary | ICD-10-CM | POA: Diagnosis not present

## 2016-02-12 DIAGNOSIS — E559 Vitamin D deficiency, unspecified: Secondary | ICD-10-CM | POA: Diagnosis not present

## 2016-02-12 DIAGNOSIS — M549 Dorsalgia, unspecified: Secondary | ICD-10-CM | POA: Diagnosis not present

## 2016-02-12 DIAGNOSIS — Z0000 Encounter for general adult medical examination without abnormal findings: Secondary | ICD-10-CM | POA: Diagnosis not present

## 2016-02-12 DIAGNOSIS — M546 Pain in thoracic spine: Secondary | ICD-10-CM | POA: Diagnosis not present

## 2016-02-12 DIAGNOSIS — M5489 Other dorsalgia: Secondary | ICD-10-CM | POA: Diagnosis not present

## 2016-02-12 DIAGNOSIS — I10 Essential (primary) hypertension: Secondary | ICD-10-CM | POA: Diagnosis not present

## 2016-02-13 DIAGNOSIS — I10 Essential (primary) hypertension: Secondary | ICD-10-CM | POA: Diagnosis not present

## 2016-02-14 DIAGNOSIS — Z79899 Other long term (current) drug therapy: Secondary | ICD-10-CM | POA: Diagnosis not present

## 2016-02-14 DIAGNOSIS — I10 Essential (primary) hypertension: Secondary | ICD-10-CM | POA: Diagnosis not present

## 2016-02-14 DIAGNOSIS — G35 Multiple sclerosis: Secondary | ICD-10-CM | POA: Diagnosis not present

## 2016-02-14 DIAGNOSIS — D899 Disorder involving the immune mechanism, unspecified: Secondary | ICD-10-CM | POA: Diagnosis not present

## 2016-02-15 DIAGNOSIS — I10 Essential (primary) hypertension: Secondary | ICD-10-CM | POA: Diagnosis not present

## 2016-02-16 DIAGNOSIS — I10 Essential (primary) hypertension: Secondary | ICD-10-CM | POA: Diagnosis not present

## 2016-02-17 DIAGNOSIS — G35 Multiple sclerosis: Secondary | ICD-10-CM | POA: Diagnosis not present

## 2016-02-17 DIAGNOSIS — G4733 Obstructive sleep apnea (adult) (pediatric): Secondary | ICD-10-CM | POA: Diagnosis not present

## 2016-02-18 DIAGNOSIS — G4733 Obstructive sleep apnea (adult) (pediatric): Secondary | ICD-10-CM | POA: Diagnosis not present

## 2016-02-18 DIAGNOSIS — I10 Essential (primary) hypertension: Secondary | ICD-10-CM | POA: Diagnosis not present

## 2016-02-18 DIAGNOSIS — G35 Multiple sclerosis: Secondary | ICD-10-CM | POA: Diagnosis not present

## 2016-02-19 DIAGNOSIS — I10 Essential (primary) hypertension: Secondary | ICD-10-CM | POA: Diagnosis not present

## 2016-02-20 ENCOUNTER — Other Ambulatory Visit: Payer: Self-pay | Admitting: Internal Medicine

## 2016-02-20 ENCOUNTER — Ambulatory Visit (INDEPENDENT_AMBULATORY_CARE_PROVIDER_SITE_OTHER): Payer: Commercial Managed Care - HMO

## 2016-02-20 ENCOUNTER — Other Ambulatory Visit: Payer: Self-pay | Admitting: Specialist

## 2016-02-20 DIAGNOSIS — M545 Low back pain: Secondary | ICD-10-CM

## 2016-02-20 DIAGNOSIS — R7611 Nonspecific reaction to tuberculin skin test without active tuberculosis: Secondary | ICD-10-CM | POA: Diagnosis not present

## 2016-02-20 DIAGNOSIS — M546 Pain in thoracic spine: Secondary | ICD-10-CM

## 2016-02-20 DIAGNOSIS — I10 Essential (primary) hypertension: Secondary | ICD-10-CM | POA: Diagnosis not present

## 2016-02-21 DIAGNOSIS — I10 Essential (primary) hypertension: Secondary | ICD-10-CM | POA: Diagnosis not present

## 2016-02-22 DIAGNOSIS — I10 Essential (primary) hypertension: Secondary | ICD-10-CM | POA: Diagnosis not present

## 2016-02-23 DIAGNOSIS — I10 Essential (primary) hypertension: Secondary | ICD-10-CM | POA: Diagnosis not present

## 2016-02-25 ENCOUNTER — Other Ambulatory Visit: Payer: Self-pay | Admitting: Specialist

## 2016-02-25 DIAGNOSIS — I10 Essential (primary) hypertension: Secondary | ICD-10-CM | POA: Diagnosis not present

## 2016-02-25 DIAGNOSIS — G35 Multiple sclerosis: Secondary | ICD-10-CM

## 2016-02-26 DIAGNOSIS — I10 Essential (primary) hypertension: Secondary | ICD-10-CM | POA: Diagnosis not present

## 2016-02-27 DIAGNOSIS — I10 Essential (primary) hypertension: Secondary | ICD-10-CM | POA: Diagnosis not present

## 2016-02-28 DIAGNOSIS — I10 Essential (primary) hypertension: Secondary | ICD-10-CM | POA: Diagnosis not present

## 2016-02-29 DIAGNOSIS — I10 Essential (primary) hypertension: Secondary | ICD-10-CM | POA: Diagnosis not present

## 2016-03-01 DIAGNOSIS — I10 Essential (primary) hypertension: Secondary | ICD-10-CM | POA: Diagnosis not present

## 2016-03-03 DIAGNOSIS — I10 Essential (primary) hypertension: Secondary | ICD-10-CM | POA: Diagnosis not present

## 2016-03-04 DIAGNOSIS — I10 Essential (primary) hypertension: Secondary | ICD-10-CM | POA: Diagnosis not present

## 2016-03-05 DIAGNOSIS — I10 Essential (primary) hypertension: Secondary | ICD-10-CM | POA: Diagnosis not present

## 2016-03-06 DIAGNOSIS — I10 Essential (primary) hypertension: Secondary | ICD-10-CM | POA: Diagnosis not present

## 2016-03-07 DIAGNOSIS — I10 Essential (primary) hypertension: Secondary | ICD-10-CM | POA: Diagnosis not present

## 2016-03-08 DIAGNOSIS — I10 Essential (primary) hypertension: Secondary | ICD-10-CM | POA: Diagnosis not present

## 2016-03-10 DIAGNOSIS — I10 Essential (primary) hypertension: Secondary | ICD-10-CM | POA: Diagnosis not present

## 2016-03-11 DIAGNOSIS — I10 Essential (primary) hypertension: Secondary | ICD-10-CM | POA: Diagnosis not present

## 2016-03-18 DIAGNOSIS — G35 Multiple sclerosis: Secondary | ICD-10-CM | POA: Diagnosis not present

## 2016-03-18 DIAGNOSIS — G4733 Obstructive sleep apnea (adult) (pediatric): Secondary | ICD-10-CM | POA: Diagnosis not present

## 2016-03-24 DIAGNOSIS — M549 Dorsalgia, unspecified: Secondary | ICD-10-CM | POA: Diagnosis not present

## 2016-03-25 DIAGNOSIS — M549 Dorsalgia, unspecified: Secondary | ICD-10-CM | POA: Diagnosis not present

## 2016-03-26 DIAGNOSIS — M549 Dorsalgia, unspecified: Secondary | ICD-10-CM | POA: Diagnosis not present

## 2016-03-27 DIAGNOSIS — M549 Dorsalgia, unspecified: Secondary | ICD-10-CM | POA: Diagnosis not present

## 2016-03-28 DIAGNOSIS — M549 Dorsalgia, unspecified: Secondary | ICD-10-CM | POA: Diagnosis not present

## 2016-03-29 DIAGNOSIS — M549 Dorsalgia, unspecified: Secondary | ICD-10-CM | POA: Diagnosis not present

## 2016-03-31 DIAGNOSIS — M549 Dorsalgia, unspecified: Secondary | ICD-10-CM | POA: Diagnosis not present

## 2016-04-01 DIAGNOSIS — M549 Dorsalgia, unspecified: Secondary | ICD-10-CM | POA: Diagnosis not present

## 2016-04-02 DIAGNOSIS — M549 Dorsalgia, unspecified: Secondary | ICD-10-CM | POA: Diagnosis not present

## 2016-04-03 DIAGNOSIS — M549 Dorsalgia, unspecified: Secondary | ICD-10-CM | POA: Diagnosis not present

## 2016-04-04 DIAGNOSIS — M549 Dorsalgia, unspecified: Secondary | ICD-10-CM | POA: Diagnosis not present

## 2016-04-05 DIAGNOSIS — M549 Dorsalgia, unspecified: Secondary | ICD-10-CM | POA: Diagnosis not present

## 2016-04-07 DIAGNOSIS — M549 Dorsalgia, unspecified: Secondary | ICD-10-CM | POA: Diagnosis not present

## 2016-04-08 DIAGNOSIS — M549 Dorsalgia, unspecified: Secondary | ICD-10-CM | POA: Diagnosis not present

## 2016-04-09 DIAGNOSIS — M549 Dorsalgia, unspecified: Secondary | ICD-10-CM | POA: Diagnosis not present

## 2016-04-10 DIAGNOSIS — M549 Dorsalgia, unspecified: Secondary | ICD-10-CM | POA: Diagnosis not present

## 2016-04-11 DIAGNOSIS — M549 Dorsalgia, unspecified: Secondary | ICD-10-CM | POA: Diagnosis not present

## 2016-04-12 DIAGNOSIS — M549 Dorsalgia, unspecified: Secondary | ICD-10-CM | POA: Diagnosis not present

## 2016-04-14 DIAGNOSIS — M549 Dorsalgia, unspecified: Secondary | ICD-10-CM | POA: Diagnosis not present

## 2016-04-15 DIAGNOSIS — M549 Dorsalgia, unspecified: Secondary | ICD-10-CM | POA: Diagnosis not present

## 2016-04-16 DIAGNOSIS — M549 Dorsalgia, unspecified: Secondary | ICD-10-CM | POA: Diagnosis not present

## 2016-04-17 DIAGNOSIS — M549 Dorsalgia, unspecified: Secondary | ICD-10-CM | POA: Diagnosis not present

## 2016-04-18 DIAGNOSIS — G35 Multiple sclerosis: Secondary | ICD-10-CM | POA: Diagnosis not present

## 2016-04-18 DIAGNOSIS — M549 Dorsalgia, unspecified: Secondary | ICD-10-CM | POA: Diagnosis not present

## 2016-04-18 DIAGNOSIS — G4733 Obstructive sleep apnea (adult) (pediatric): Secondary | ICD-10-CM | POA: Diagnosis not present

## 2016-04-19 DIAGNOSIS — M549 Dorsalgia, unspecified: Secondary | ICD-10-CM | POA: Diagnosis not present

## 2016-04-21 DIAGNOSIS — M549 Dorsalgia, unspecified: Secondary | ICD-10-CM | POA: Diagnosis not present

## 2016-04-22 DIAGNOSIS — M549 Dorsalgia, unspecified: Secondary | ICD-10-CM | POA: Diagnosis not present

## 2016-04-23 DIAGNOSIS — M549 Dorsalgia, unspecified: Secondary | ICD-10-CM | POA: Diagnosis not present

## 2016-04-24 DIAGNOSIS — M549 Dorsalgia, unspecified: Secondary | ICD-10-CM | POA: Diagnosis not present

## 2016-04-25 DIAGNOSIS — M549 Dorsalgia, unspecified: Secondary | ICD-10-CM | POA: Diagnosis not present

## 2016-04-26 DIAGNOSIS — M549 Dorsalgia, unspecified: Secondary | ICD-10-CM | POA: Diagnosis not present

## 2016-04-28 DIAGNOSIS — M549 Dorsalgia, unspecified: Secondary | ICD-10-CM | POA: Diagnosis not present

## 2016-04-29 DIAGNOSIS — M549 Dorsalgia, unspecified: Secondary | ICD-10-CM | POA: Diagnosis not present

## 2016-04-30 DIAGNOSIS — M549 Dorsalgia, unspecified: Secondary | ICD-10-CM | POA: Diagnosis not present

## 2016-05-01 DIAGNOSIS — M549 Dorsalgia, unspecified: Secondary | ICD-10-CM | POA: Diagnosis not present

## 2016-05-02 DIAGNOSIS — M549 Dorsalgia, unspecified: Secondary | ICD-10-CM | POA: Diagnosis not present

## 2016-05-03 DIAGNOSIS — M549 Dorsalgia, unspecified: Secondary | ICD-10-CM | POA: Diagnosis not present

## 2016-05-05 DIAGNOSIS — M549 Dorsalgia, unspecified: Secondary | ICD-10-CM | POA: Diagnosis not present

## 2016-05-06 DIAGNOSIS — M549 Dorsalgia, unspecified: Secondary | ICD-10-CM | POA: Diagnosis not present

## 2016-05-07 DIAGNOSIS — M549 Dorsalgia, unspecified: Secondary | ICD-10-CM | POA: Diagnosis not present

## 2016-05-08 DIAGNOSIS — M549 Dorsalgia, unspecified: Secondary | ICD-10-CM | POA: Diagnosis not present

## 2016-05-09 DIAGNOSIS — M549 Dorsalgia, unspecified: Secondary | ICD-10-CM | POA: Diagnosis not present

## 2016-05-10 DIAGNOSIS — M549 Dorsalgia, unspecified: Secondary | ICD-10-CM | POA: Diagnosis not present

## 2016-05-12 DIAGNOSIS — M549 Dorsalgia, unspecified: Secondary | ICD-10-CM | POA: Diagnosis not present

## 2016-05-13 DIAGNOSIS — M549 Dorsalgia, unspecified: Secondary | ICD-10-CM | POA: Diagnosis not present

## 2016-05-14 DIAGNOSIS — M549 Dorsalgia, unspecified: Secondary | ICD-10-CM | POA: Diagnosis not present

## 2016-05-15 DIAGNOSIS — M549 Dorsalgia, unspecified: Secondary | ICD-10-CM | POA: Diagnosis not present

## 2016-05-16 DIAGNOSIS — M549 Dorsalgia, unspecified: Secondary | ICD-10-CM | POA: Diagnosis not present

## 2016-05-17 DIAGNOSIS — M549 Dorsalgia, unspecified: Secondary | ICD-10-CM | POA: Diagnosis not present

## 2016-05-19 DIAGNOSIS — G35 Multiple sclerosis: Secondary | ICD-10-CM | POA: Diagnosis not present

## 2016-05-19 DIAGNOSIS — L723 Sebaceous cyst: Secondary | ICD-10-CM | POA: Diagnosis not present

## 2016-05-19 DIAGNOSIS — G4733 Obstructive sleep apnea (adult) (pediatric): Secondary | ICD-10-CM | POA: Diagnosis not present

## 2016-05-19 DIAGNOSIS — M549 Dorsalgia, unspecified: Secondary | ICD-10-CM | POA: Diagnosis not present

## 2016-05-19 DIAGNOSIS — Z681 Body mass index (BMI) 19 or less, adult: Secondary | ICD-10-CM | POA: Diagnosis not present

## 2016-05-20 DIAGNOSIS — M549 Dorsalgia, unspecified: Secondary | ICD-10-CM | POA: Diagnosis not present

## 2016-05-21 DIAGNOSIS — M549 Dorsalgia, unspecified: Secondary | ICD-10-CM | POA: Diagnosis not present

## 2016-05-22 DIAGNOSIS — M549 Dorsalgia, unspecified: Secondary | ICD-10-CM | POA: Diagnosis not present

## 2016-05-23 DIAGNOSIS — M549 Dorsalgia, unspecified: Secondary | ICD-10-CM | POA: Diagnosis not present

## 2016-05-24 DIAGNOSIS — M549 Dorsalgia, unspecified: Secondary | ICD-10-CM | POA: Diagnosis not present

## 2016-05-26 DIAGNOSIS — M549 Dorsalgia, unspecified: Secondary | ICD-10-CM | POA: Diagnosis not present

## 2016-05-27 DIAGNOSIS — M549 Dorsalgia, unspecified: Secondary | ICD-10-CM | POA: Diagnosis not present

## 2016-05-28 DIAGNOSIS — M549 Dorsalgia, unspecified: Secondary | ICD-10-CM | POA: Diagnosis not present

## 2016-05-29 DIAGNOSIS — M549 Dorsalgia, unspecified: Secondary | ICD-10-CM | POA: Diagnosis not present

## 2016-05-30 DIAGNOSIS — M549 Dorsalgia, unspecified: Secondary | ICD-10-CM | POA: Diagnosis not present

## 2016-05-31 DIAGNOSIS — M549 Dorsalgia, unspecified: Secondary | ICD-10-CM | POA: Diagnosis not present

## 2016-06-02 DIAGNOSIS — M549 Dorsalgia, unspecified: Secondary | ICD-10-CM | POA: Diagnosis not present

## 2016-06-03 DIAGNOSIS — M549 Dorsalgia, unspecified: Secondary | ICD-10-CM | POA: Diagnosis not present

## 2016-06-04 DIAGNOSIS — M549 Dorsalgia, unspecified: Secondary | ICD-10-CM | POA: Diagnosis not present

## 2016-06-05 DIAGNOSIS — M549 Dorsalgia, unspecified: Secondary | ICD-10-CM | POA: Diagnosis not present

## 2016-06-06 DIAGNOSIS — M549 Dorsalgia, unspecified: Secondary | ICD-10-CM | POA: Diagnosis not present

## 2016-06-07 DIAGNOSIS — M549 Dorsalgia, unspecified: Secondary | ICD-10-CM | POA: Diagnosis not present

## 2016-06-09 DIAGNOSIS — M549 Dorsalgia, unspecified: Secondary | ICD-10-CM | POA: Diagnosis not present

## 2016-06-10 DIAGNOSIS — M549 Dorsalgia, unspecified: Secondary | ICD-10-CM | POA: Diagnosis not present

## 2016-06-11 DIAGNOSIS — M549 Dorsalgia, unspecified: Secondary | ICD-10-CM | POA: Diagnosis not present

## 2016-06-12 DIAGNOSIS — M549 Dorsalgia, unspecified: Secondary | ICD-10-CM | POA: Diagnosis not present

## 2016-06-13 DIAGNOSIS — G894 Chronic pain syndrome: Secondary | ICD-10-CM | POA: Diagnosis not present

## 2016-06-13 DIAGNOSIS — R5383 Other fatigue: Secondary | ICD-10-CM | POA: Diagnosis not present

## 2016-06-13 DIAGNOSIS — M549 Dorsalgia, unspecified: Secondary | ICD-10-CM | POA: Diagnosis not present

## 2016-06-13 DIAGNOSIS — R51 Headache: Secondary | ICD-10-CM | POA: Diagnosis not present

## 2016-06-13 DIAGNOSIS — G35 Multiple sclerosis: Secondary | ICD-10-CM | POA: Diagnosis not present

## 2016-06-14 DIAGNOSIS — M549 Dorsalgia, unspecified: Secondary | ICD-10-CM | POA: Diagnosis not present

## 2016-06-16 DIAGNOSIS — G35 Multiple sclerosis: Secondary | ICD-10-CM | POA: Diagnosis not present

## 2016-06-16 DIAGNOSIS — R5383 Other fatigue: Secondary | ICD-10-CM | POA: Diagnosis not present

## 2016-06-16 DIAGNOSIS — M549 Dorsalgia, unspecified: Secondary | ICD-10-CM | POA: Diagnosis not present

## 2016-06-16 DIAGNOSIS — G894 Chronic pain syndrome: Secondary | ICD-10-CM | POA: Diagnosis not present

## 2016-06-17 DIAGNOSIS — M549 Dorsalgia, unspecified: Secondary | ICD-10-CM | POA: Diagnosis not present

## 2016-06-18 DIAGNOSIS — G35 Multiple sclerosis: Secondary | ICD-10-CM | POA: Diagnosis not present

## 2016-06-18 DIAGNOSIS — G4733 Obstructive sleep apnea (adult) (pediatric): Secondary | ICD-10-CM | POA: Diagnosis not present

## 2016-06-18 DIAGNOSIS — M549 Dorsalgia, unspecified: Secondary | ICD-10-CM | POA: Diagnosis not present

## 2016-06-19 DIAGNOSIS — M549 Dorsalgia, unspecified: Secondary | ICD-10-CM | POA: Diagnosis not present

## 2016-06-20 DIAGNOSIS — M549 Dorsalgia, unspecified: Secondary | ICD-10-CM | POA: Diagnosis not present

## 2016-06-21 DIAGNOSIS — M549 Dorsalgia, unspecified: Secondary | ICD-10-CM | POA: Diagnosis not present

## 2016-06-23 DIAGNOSIS — M549 Dorsalgia, unspecified: Secondary | ICD-10-CM | POA: Diagnosis not present

## 2016-06-24 DIAGNOSIS — M549 Dorsalgia, unspecified: Secondary | ICD-10-CM | POA: Diagnosis not present

## 2016-06-25 DIAGNOSIS — M549 Dorsalgia, unspecified: Secondary | ICD-10-CM | POA: Diagnosis not present

## 2016-06-26 DIAGNOSIS — M549 Dorsalgia, unspecified: Secondary | ICD-10-CM | POA: Diagnosis not present

## 2016-06-27 DIAGNOSIS — M549 Dorsalgia, unspecified: Secondary | ICD-10-CM | POA: Diagnosis not present

## 2016-06-28 DIAGNOSIS — M549 Dorsalgia, unspecified: Secondary | ICD-10-CM | POA: Diagnosis not present

## 2016-06-30 DIAGNOSIS — M549 Dorsalgia, unspecified: Secondary | ICD-10-CM | POA: Diagnosis not present

## 2016-07-01 DIAGNOSIS — M549 Dorsalgia, unspecified: Secondary | ICD-10-CM | POA: Diagnosis not present

## 2016-07-02 DIAGNOSIS — M549 Dorsalgia, unspecified: Secondary | ICD-10-CM | POA: Diagnosis not present

## 2016-07-03 DIAGNOSIS — M549 Dorsalgia, unspecified: Secondary | ICD-10-CM | POA: Diagnosis not present

## 2016-07-04 DIAGNOSIS — M549 Dorsalgia, unspecified: Secondary | ICD-10-CM | POA: Diagnosis not present

## 2016-07-05 DIAGNOSIS — M549 Dorsalgia, unspecified: Secondary | ICD-10-CM | POA: Diagnosis not present

## 2016-07-07 DIAGNOSIS — M549 Dorsalgia, unspecified: Secondary | ICD-10-CM | POA: Diagnosis not present

## 2016-07-08 DIAGNOSIS — M549 Dorsalgia, unspecified: Secondary | ICD-10-CM | POA: Diagnosis not present

## 2016-07-09 DIAGNOSIS — M549 Dorsalgia, unspecified: Secondary | ICD-10-CM | POA: Diagnosis not present

## 2016-07-10 DIAGNOSIS — M549 Dorsalgia, unspecified: Secondary | ICD-10-CM | POA: Diagnosis not present

## 2016-07-11 DIAGNOSIS — M5031 Other cervical disc degeneration,  high cervical region: Secondary | ICD-10-CM | POA: Diagnosis not present

## 2016-07-11 DIAGNOSIS — G35 Multiple sclerosis: Secondary | ICD-10-CM | POA: Diagnosis not present

## 2016-07-11 DIAGNOSIS — R9089 Other abnormal findings on diagnostic imaging of central nervous system: Secondary | ICD-10-CM | POA: Diagnosis not present

## 2016-07-11 DIAGNOSIS — M549 Dorsalgia, unspecified: Secondary | ICD-10-CM | POA: Diagnosis not present

## 2016-07-11 DIAGNOSIS — M4802 Spinal stenosis, cervical region: Secondary | ICD-10-CM | POA: Diagnosis not present

## 2016-07-12 DIAGNOSIS — M549 Dorsalgia, unspecified: Secondary | ICD-10-CM | POA: Diagnosis not present

## 2016-07-14 DIAGNOSIS — M549 Dorsalgia, unspecified: Secondary | ICD-10-CM | POA: Diagnosis not present

## 2016-07-15 DIAGNOSIS — M549 Dorsalgia, unspecified: Secondary | ICD-10-CM | POA: Diagnosis not present

## 2016-07-16 DIAGNOSIS — M549 Dorsalgia, unspecified: Secondary | ICD-10-CM | POA: Diagnosis not present

## 2016-07-17 DIAGNOSIS — M549 Dorsalgia, unspecified: Secondary | ICD-10-CM | POA: Diagnosis not present

## 2016-07-18 DIAGNOSIS — G4733 Obstructive sleep apnea (adult) (pediatric): Secondary | ICD-10-CM | POA: Diagnosis not present

## 2016-07-18 DIAGNOSIS — Z681 Body mass index (BMI) 19 or less, adult: Secondary | ICD-10-CM | POA: Diagnosis not present

## 2016-07-18 DIAGNOSIS — G35 Multiple sclerosis: Secondary | ICD-10-CM | POA: Diagnosis not present

## 2016-07-18 DIAGNOSIS — L723 Sebaceous cyst: Secondary | ICD-10-CM | POA: Diagnosis not present

## 2016-07-18 DIAGNOSIS — M549 Dorsalgia, unspecified: Secondary | ICD-10-CM | POA: Diagnosis not present

## 2016-07-18 DIAGNOSIS — N528 Other male erectile dysfunction: Secondary | ICD-10-CM | POA: Diagnosis not present

## 2016-07-18 DIAGNOSIS — E784 Other hyperlipidemia: Secondary | ICD-10-CM | POA: Diagnosis not present

## 2016-07-19 DIAGNOSIS — G35 Multiple sclerosis: Secondary | ICD-10-CM | POA: Diagnosis not present

## 2016-07-19 DIAGNOSIS — G4733 Obstructive sleep apnea (adult) (pediatric): Secondary | ICD-10-CM | POA: Diagnosis not present

## 2016-07-19 DIAGNOSIS — M549 Dorsalgia, unspecified: Secondary | ICD-10-CM | POA: Diagnosis not present

## 2016-07-21 DIAGNOSIS — M549 Dorsalgia, unspecified: Secondary | ICD-10-CM | POA: Diagnosis not present

## 2016-07-22 DIAGNOSIS — G4733 Obstructive sleep apnea (adult) (pediatric): Secondary | ICD-10-CM | POA: Diagnosis not present

## 2016-07-22 DIAGNOSIS — G35 Multiple sclerosis: Secondary | ICD-10-CM | POA: Diagnosis not present

## 2016-07-22 DIAGNOSIS — M549 Dorsalgia, unspecified: Secondary | ICD-10-CM | POA: Diagnosis not present

## 2016-07-23 DIAGNOSIS — M549 Dorsalgia, unspecified: Secondary | ICD-10-CM | POA: Diagnosis not present

## 2016-07-24 DIAGNOSIS — M549 Dorsalgia, unspecified: Secondary | ICD-10-CM | POA: Diagnosis not present

## 2016-07-25 DIAGNOSIS — M549 Dorsalgia, unspecified: Secondary | ICD-10-CM | POA: Diagnosis not present

## 2016-07-26 DIAGNOSIS — M549 Dorsalgia, unspecified: Secondary | ICD-10-CM | POA: Diagnosis not present

## 2016-07-28 DIAGNOSIS — M549 Dorsalgia, unspecified: Secondary | ICD-10-CM | POA: Diagnosis not present

## 2016-07-29 DIAGNOSIS — M549 Dorsalgia, unspecified: Secondary | ICD-10-CM | POA: Diagnosis not present

## 2016-07-30 DIAGNOSIS — M549 Dorsalgia, unspecified: Secondary | ICD-10-CM | POA: Diagnosis not present

## 2016-07-31 DIAGNOSIS — M549 Dorsalgia, unspecified: Secondary | ICD-10-CM | POA: Diagnosis not present

## 2016-08-01 DIAGNOSIS — M549 Dorsalgia, unspecified: Secondary | ICD-10-CM | POA: Diagnosis not present

## 2016-08-02 DIAGNOSIS — M549 Dorsalgia, unspecified: Secondary | ICD-10-CM | POA: Diagnosis not present

## 2016-08-04 DIAGNOSIS — M549 Dorsalgia, unspecified: Secondary | ICD-10-CM | POA: Diagnosis not present

## 2016-08-05 DIAGNOSIS — M549 Dorsalgia, unspecified: Secondary | ICD-10-CM | POA: Diagnosis not present

## 2016-08-06 DIAGNOSIS — M549 Dorsalgia, unspecified: Secondary | ICD-10-CM | POA: Diagnosis not present

## 2016-08-07 DIAGNOSIS — M549 Dorsalgia, unspecified: Secondary | ICD-10-CM | POA: Diagnosis not present

## 2016-08-08 DIAGNOSIS — M549 Dorsalgia, unspecified: Secondary | ICD-10-CM | POA: Diagnosis not present

## 2016-08-09 DIAGNOSIS — M549 Dorsalgia, unspecified: Secondary | ICD-10-CM | POA: Diagnosis not present

## 2016-08-11 DIAGNOSIS — M549 Dorsalgia, unspecified: Secondary | ICD-10-CM | POA: Diagnosis not present

## 2016-08-12 DIAGNOSIS — M549 Dorsalgia, unspecified: Secondary | ICD-10-CM | POA: Diagnosis not present

## 2016-08-13 DIAGNOSIS — M549 Dorsalgia, unspecified: Secondary | ICD-10-CM | POA: Diagnosis not present

## 2016-08-14 DIAGNOSIS — M549 Dorsalgia, unspecified: Secondary | ICD-10-CM | POA: Diagnosis not present

## 2016-08-15 DIAGNOSIS — M549 Dorsalgia, unspecified: Secondary | ICD-10-CM | POA: Diagnosis not present

## 2016-08-16 DIAGNOSIS — M549 Dorsalgia, unspecified: Secondary | ICD-10-CM | POA: Diagnosis not present

## 2016-08-18 DIAGNOSIS — M549 Dorsalgia, unspecified: Secondary | ICD-10-CM | POA: Diagnosis not present

## 2016-08-18 DIAGNOSIS — G35 Multiple sclerosis: Secondary | ICD-10-CM | POA: Diagnosis not present

## 2016-08-18 DIAGNOSIS — G4733 Obstructive sleep apnea (adult) (pediatric): Secondary | ICD-10-CM | POA: Diagnosis not present

## 2016-08-19 DIAGNOSIS — M549 Dorsalgia, unspecified: Secondary | ICD-10-CM | POA: Diagnosis not present

## 2016-08-20 DIAGNOSIS — M549 Dorsalgia, unspecified: Secondary | ICD-10-CM | POA: Diagnosis not present

## 2016-08-21 DIAGNOSIS — M549 Dorsalgia, unspecified: Secondary | ICD-10-CM | POA: Diagnosis not present

## 2016-08-22 DIAGNOSIS — M549 Dorsalgia, unspecified: Secondary | ICD-10-CM | POA: Diagnosis not present

## 2016-08-23 DIAGNOSIS — M549 Dorsalgia, unspecified: Secondary | ICD-10-CM | POA: Diagnosis not present

## 2016-08-25 DIAGNOSIS — M549 Dorsalgia, unspecified: Secondary | ICD-10-CM | POA: Diagnosis not present

## 2016-08-26 DIAGNOSIS — M549 Dorsalgia, unspecified: Secondary | ICD-10-CM | POA: Diagnosis not present

## 2016-08-27 DIAGNOSIS — M549 Dorsalgia, unspecified: Secondary | ICD-10-CM | POA: Diagnosis not present

## 2016-08-28 DIAGNOSIS — M549 Dorsalgia, unspecified: Secondary | ICD-10-CM | POA: Diagnosis not present

## 2016-08-29 DIAGNOSIS — M549 Dorsalgia, unspecified: Secondary | ICD-10-CM | POA: Diagnosis not present

## 2016-08-30 DIAGNOSIS — M549 Dorsalgia, unspecified: Secondary | ICD-10-CM | POA: Diagnosis not present

## 2016-09-01 DIAGNOSIS — M549 Dorsalgia, unspecified: Secondary | ICD-10-CM | POA: Diagnosis not present

## 2016-09-02 DIAGNOSIS — M549 Dorsalgia, unspecified: Secondary | ICD-10-CM | POA: Diagnosis not present

## 2016-09-03 DIAGNOSIS — M549 Dorsalgia, unspecified: Secondary | ICD-10-CM | POA: Diagnosis not present

## 2016-09-04 DIAGNOSIS — M549 Dorsalgia, unspecified: Secondary | ICD-10-CM | POA: Diagnosis not present

## 2016-09-05 DIAGNOSIS — M549 Dorsalgia, unspecified: Secondary | ICD-10-CM | POA: Diagnosis not present

## 2016-09-06 DIAGNOSIS — M549 Dorsalgia, unspecified: Secondary | ICD-10-CM | POA: Diagnosis not present

## 2016-09-08 DIAGNOSIS — M549 Dorsalgia, unspecified: Secondary | ICD-10-CM | POA: Diagnosis not present

## 2016-09-09 DIAGNOSIS — M549 Dorsalgia, unspecified: Secondary | ICD-10-CM | POA: Diagnosis not present

## 2016-09-10 DIAGNOSIS — M549 Dorsalgia, unspecified: Secondary | ICD-10-CM | POA: Diagnosis not present

## 2016-09-11 DIAGNOSIS — M549 Dorsalgia, unspecified: Secondary | ICD-10-CM | POA: Diagnosis not present

## 2016-09-12 DIAGNOSIS — M549 Dorsalgia, unspecified: Secondary | ICD-10-CM | POA: Diagnosis not present

## 2016-09-13 DIAGNOSIS — M549 Dorsalgia, unspecified: Secondary | ICD-10-CM | POA: Diagnosis not present

## 2016-09-15 DIAGNOSIS — M549 Dorsalgia, unspecified: Secondary | ICD-10-CM | POA: Diagnosis not present

## 2016-09-16 DIAGNOSIS — M549 Dorsalgia, unspecified: Secondary | ICD-10-CM | POA: Diagnosis not present

## 2016-09-17 DIAGNOSIS — M549 Dorsalgia, unspecified: Secondary | ICD-10-CM | POA: Diagnosis not present

## 2016-09-18 DIAGNOSIS — G35 Multiple sclerosis: Secondary | ICD-10-CM | POA: Diagnosis not present

## 2016-09-18 DIAGNOSIS — M549 Dorsalgia, unspecified: Secondary | ICD-10-CM | POA: Diagnosis not present

## 2016-09-18 DIAGNOSIS — G4733 Obstructive sleep apnea (adult) (pediatric): Secondary | ICD-10-CM | POA: Diagnosis not present

## 2016-09-19 DIAGNOSIS — M549 Dorsalgia, unspecified: Secondary | ICD-10-CM | POA: Diagnosis not present

## 2016-09-20 DIAGNOSIS — M549 Dorsalgia, unspecified: Secondary | ICD-10-CM | POA: Diagnosis not present

## 2016-09-22 DIAGNOSIS — M549 Dorsalgia, unspecified: Secondary | ICD-10-CM | POA: Diagnosis not present

## 2016-09-23 DIAGNOSIS — M549 Dorsalgia, unspecified: Secondary | ICD-10-CM | POA: Diagnosis not present

## 2016-09-24 DIAGNOSIS — M549 Dorsalgia, unspecified: Secondary | ICD-10-CM | POA: Diagnosis not present

## 2016-09-25 DIAGNOSIS — M549 Dorsalgia, unspecified: Secondary | ICD-10-CM | POA: Diagnosis not present

## 2016-09-26 DIAGNOSIS — M542 Cervicalgia: Secondary | ICD-10-CM | POA: Diagnosis not present

## 2016-09-26 DIAGNOSIS — G5603 Carpal tunnel syndrome, bilateral upper limbs: Secondary | ICD-10-CM | POA: Diagnosis not present

## 2016-09-26 DIAGNOSIS — M549 Dorsalgia, unspecified: Secondary | ICD-10-CM | POA: Diagnosis not present

## 2016-09-26 DIAGNOSIS — R202 Paresthesia of skin: Secondary | ICD-10-CM | POA: Diagnosis not present

## 2016-09-26 DIAGNOSIS — M5417 Radiculopathy, lumbosacral region: Secondary | ICD-10-CM | POA: Diagnosis not present

## 2016-09-26 DIAGNOSIS — M5412 Radiculopathy, cervical region: Secondary | ICD-10-CM | POA: Diagnosis not present

## 2016-09-26 DIAGNOSIS — G603 Idiopathic progressive neuropathy: Secondary | ICD-10-CM | POA: Diagnosis not present

## 2016-09-26 DIAGNOSIS — G35 Multiple sclerosis: Secondary | ICD-10-CM | POA: Diagnosis not present

## 2016-09-27 DIAGNOSIS — M549 Dorsalgia, unspecified: Secondary | ICD-10-CM | POA: Diagnosis not present

## 2016-09-29 DIAGNOSIS — M549 Dorsalgia, unspecified: Secondary | ICD-10-CM | POA: Diagnosis not present

## 2016-09-30 DIAGNOSIS — M549 Dorsalgia, unspecified: Secondary | ICD-10-CM | POA: Diagnosis not present

## 2016-10-01 DIAGNOSIS — M549 Dorsalgia, unspecified: Secondary | ICD-10-CM | POA: Diagnosis not present

## 2016-10-02 DIAGNOSIS — M549 Dorsalgia, unspecified: Secondary | ICD-10-CM | POA: Diagnosis not present

## 2016-10-03 DIAGNOSIS — M549 Dorsalgia, unspecified: Secondary | ICD-10-CM | POA: Diagnosis not present

## 2016-10-04 DIAGNOSIS — M549 Dorsalgia, unspecified: Secondary | ICD-10-CM | POA: Diagnosis not present

## 2016-10-06 DIAGNOSIS — M549 Dorsalgia, unspecified: Secondary | ICD-10-CM | POA: Diagnosis not present

## 2016-10-07 DIAGNOSIS — M549 Dorsalgia, unspecified: Secondary | ICD-10-CM | POA: Diagnosis not present

## 2016-10-08 DIAGNOSIS — M549 Dorsalgia, unspecified: Secondary | ICD-10-CM | POA: Diagnosis not present

## 2016-10-09 DIAGNOSIS — G4733 Obstructive sleep apnea (adult) (pediatric): Secondary | ICD-10-CM | POA: Diagnosis not present

## 2016-10-09 DIAGNOSIS — M549 Dorsalgia, unspecified: Secondary | ICD-10-CM | POA: Diagnosis not present

## 2016-10-10 DIAGNOSIS — M549 Dorsalgia, unspecified: Secondary | ICD-10-CM | POA: Diagnosis not present

## 2016-10-11 DIAGNOSIS — M549 Dorsalgia, unspecified: Secondary | ICD-10-CM | POA: Diagnosis not present

## 2016-10-13 DIAGNOSIS — M549 Dorsalgia, unspecified: Secondary | ICD-10-CM | POA: Diagnosis not present

## 2016-10-14 DIAGNOSIS — M549 Dorsalgia, unspecified: Secondary | ICD-10-CM | POA: Diagnosis not present

## 2016-10-15 DIAGNOSIS — M549 Dorsalgia, unspecified: Secondary | ICD-10-CM | POA: Diagnosis not present

## 2016-10-16 DIAGNOSIS — M549 Dorsalgia, unspecified: Secondary | ICD-10-CM | POA: Diagnosis not present

## 2016-10-17 DIAGNOSIS — M549 Dorsalgia, unspecified: Secondary | ICD-10-CM | POA: Diagnosis not present

## 2016-10-18 DIAGNOSIS — M549 Dorsalgia, unspecified: Secondary | ICD-10-CM | POA: Diagnosis not present

## 2016-10-19 DIAGNOSIS — G4733 Obstructive sleep apnea (adult) (pediatric): Secondary | ICD-10-CM | POA: Diagnosis not present

## 2016-10-19 DIAGNOSIS — G35 Multiple sclerosis: Secondary | ICD-10-CM | POA: Diagnosis not present

## 2016-10-20 DIAGNOSIS — M549 Dorsalgia, unspecified: Secondary | ICD-10-CM | POA: Diagnosis not present

## 2016-10-21 DIAGNOSIS — M549 Dorsalgia, unspecified: Secondary | ICD-10-CM | POA: Diagnosis not present

## 2016-10-22 DIAGNOSIS — M549 Dorsalgia, unspecified: Secondary | ICD-10-CM | POA: Diagnosis not present

## 2016-10-23 DIAGNOSIS — M549 Dorsalgia, unspecified: Secondary | ICD-10-CM | POA: Diagnosis not present

## 2016-10-24 DIAGNOSIS — M549 Dorsalgia, unspecified: Secondary | ICD-10-CM | POA: Diagnosis not present

## 2016-10-25 DIAGNOSIS — M549 Dorsalgia, unspecified: Secondary | ICD-10-CM | POA: Diagnosis not present

## 2016-10-27 DIAGNOSIS — M549 Dorsalgia, unspecified: Secondary | ICD-10-CM | POA: Diagnosis not present

## 2016-10-28 DIAGNOSIS — M549 Dorsalgia, unspecified: Secondary | ICD-10-CM | POA: Diagnosis not present

## 2016-10-29 DIAGNOSIS — M549 Dorsalgia, unspecified: Secondary | ICD-10-CM | POA: Diagnosis not present

## 2016-10-30 DIAGNOSIS — M549 Dorsalgia, unspecified: Secondary | ICD-10-CM | POA: Diagnosis not present

## 2016-10-31 DIAGNOSIS — M549 Dorsalgia, unspecified: Secondary | ICD-10-CM | POA: Diagnosis not present

## 2016-11-01 DIAGNOSIS — M549 Dorsalgia, unspecified: Secondary | ICD-10-CM | POA: Diagnosis not present

## 2016-11-03 DIAGNOSIS — M549 Dorsalgia, unspecified: Secondary | ICD-10-CM | POA: Diagnosis not present

## 2016-11-04 DIAGNOSIS — M549 Dorsalgia, unspecified: Secondary | ICD-10-CM | POA: Diagnosis not present

## 2016-11-05 DIAGNOSIS — M549 Dorsalgia, unspecified: Secondary | ICD-10-CM | POA: Diagnosis not present

## 2016-11-06 DIAGNOSIS — M549 Dorsalgia, unspecified: Secondary | ICD-10-CM | POA: Diagnosis not present

## 2016-11-07 DIAGNOSIS — M549 Dorsalgia, unspecified: Secondary | ICD-10-CM | POA: Diagnosis not present

## 2016-11-08 DIAGNOSIS — M549 Dorsalgia, unspecified: Secondary | ICD-10-CM | POA: Diagnosis not present

## 2016-11-10 DIAGNOSIS — M549 Dorsalgia, unspecified: Secondary | ICD-10-CM | POA: Diagnosis not present

## 2016-11-11 DIAGNOSIS — M549 Dorsalgia, unspecified: Secondary | ICD-10-CM | POA: Diagnosis not present

## 2016-11-12 DIAGNOSIS — M549 Dorsalgia, unspecified: Secondary | ICD-10-CM | POA: Diagnosis not present

## 2016-11-13 DIAGNOSIS — M549 Dorsalgia, unspecified: Secondary | ICD-10-CM | POA: Diagnosis not present

## 2016-11-14 DIAGNOSIS — M549 Dorsalgia, unspecified: Secondary | ICD-10-CM | POA: Diagnosis not present

## 2016-11-15 DIAGNOSIS — M549 Dorsalgia, unspecified: Secondary | ICD-10-CM | POA: Diagnosis not present

## 2016-11-16 DIAGNOSIS — G4733 Obstructive sleep apnea (adult) (pediatric): Secondary | ICD-10-CM | POA: Diagnosis not present

## 2016-11-16 DIAGNOSIS — G35 Multiple sclerosis: Secondary | ICD-10-CM | POA: Diagnosis not present

## 2016-11-17 DIAGNOSIS — M549 Dorsalgia, unspecified: Secondary | ICD-10-CM | POA: Diagnosis not present

## 2016-11-18 DIAGNOSIS — M549 Dorsalgia, unspecified: Secondary | ICD-10-CM | POA: Diagnosis not present

## 2016-11-19 DIAGNOSIS — M549 Dorsalgia, unspecified: Secondary | ICD-10-CM | POA: Diagnosis not present

## 2016-11-20 DIAGNOSIS — M549 Dorsalgia, unspecified: Secondary | ICD-10-CM | POA: Diagnosis not present

## 2016-11-21 DIAGNOSIS — M549 Dorsalgia, unspecified: Secondary | ICD-10-CM | POA: Diagnosis not present

## 2016-11-22 DIAGNOSIS — M549 Dorsalgia, unspecified: Secondary | ICD-10-CM | POA: Diagnosis not present

## 2016-11-24 DIAGNOSIS — M549 Dorsalgia, unspecified: Secondary | ICD-10-CM | POA: Diagnosis not present

## 2016-11-25 DIAGNOSIS — M549 Dorsalgia, unspecified: Secondary | ICD-10-CM | POA: Diagnosis not present

## 2016-11-26 DIAGNOSIS — M549 Dorsalgia, unspecified: Secondary | ICD-10-CM | POA: Diagnosis not present

## 2016-11-27 DIAGNOSIS — M549 Dorsalgia, unspecified: Secondary | ICD-10-CM | POA: Diagnosis not present

## 2016-11-28 DIAGNOSIS — M549 Dorsalgia, unspecified: Secondary | ICD-10-CM | POA: Diagnosis not present

## 2016-11-29 DIAGNOSIS — M549 Dorsalgia, unspecified: Secondary | ICD-10-CM | POA: Diagnosis not present

## 2016-11-30 DIAGNOSIS — M549 Dorsalgia, unspecified: Secondary | ICD-10-CM | POA: Diagnosis not present

## 2016-12-01 DIAGNOSIS — M549 Dorsalgia, unspecified: Secondary | ICD-10-CM | POA: Diagnosis not present

## 2016-12-02 DIAGNOSIS — M549 Dorsalgia, unspecified: Secondary | ICD-10-CM | POA: Diagnosis not present

## 2016-12-03 DIAGNOSIS — M549 Dorsalgia, unspecified: Secondary | ICD-10-CM | POA: Diagnosis not present

## 2016-12-04 DIAGNOSIS — M549 Dorsalgia, unspecified: Secondary | ICD-10-CM | POA: Diagnosis not present

## 2016-12-05 DIAGNOSIS — M549 Dorsalgia, unspecified: Secondary | ICD-10-CM | POA: Diagnosis not present

## 2016-12-06 DIAGNOSIS — M549 Dorsalgia, unspecified: Secondary | ICD-10-CM | POA: Diagnosis not present

## 2016-12-07 DIAGNOSIS — M549 Dorsalgia, unspecified: Secondary | ICD-10-CM | POA: Diagnosis not present

## 2016-12-08 DIAGNOSIS — M549 Dorsalgia, unspecified: Secondary | ICD-10-CM | POA: Diagnosis not present

## 2016-12-09 DIAGNOSIS — M549 Dorsalgia, unspecified: Secondary | ICD-10-CM | POA: Diagnosis not present

## 2016-12-10 DIAGNOSIS — E559 Vitamin D deficiency, unspecified: Secondary | ICD-10-CM | POA: Diagnosis not present

## 2016-12-10 DIAGNOSIS — M5417 Radiculopathy, lumbosacral region: Secondary | ICD-10-CM | POA: Diagnosis not present

## 2016-12-10 DIAGNOSIS — M791 Myalgia: Secondary | ICD-10-CM | POA: Diagnosis not present

## 2016-12-10 DIAGNOSIS — Z79899 Other long term (current) drug therapy: Secondary | ICD-10-CM | POA: Diagnosis not present

## 2016-12-10 DIAGNOSIS — M6281 Muscle weakness (generalized): Secondary | ICD-10-CM | POA: Diagnosis not present

## 2016-12-10 DIAGNOSIS — R202 Paresthesia of skin: Secondary | ICD-10-CM | POA: Diagnosis not present

## 2016-12-10 DIAGNOSIS — G609 Hereditary and idiopathic neuropathy, unspecified: Secondary | ICD-10-CM | POA: Diagnosis not present

## 2016-12-10 DIAGNOSIS — M549 Dorsalgia, unspecified: Secondary | ICD-10-CM | POA: Diagnosis not present

## 2016-12-10 DIAGNOSIS — G35 Multiple sclerosis: Secondary | ICD-10-CM | POA: Diagnosis not present

## 2016-12-10 DIAGNOSIS — M542 Cervicalgia: Secondary | ICD-10-CM | POA: Diagnosis not present

## 2016-12-10 DIAGNOSIS — R201 Hypoesthesia of skin: Secondary | ICD-10-CM | POA: Diagnosis not present

## 2016-12-10 DIAGNOSIS — R634 Abnormal weight loss: Secondary | ICD-10-CM | POA: Diagnosis not present

## 2016-12-11 DIAGNOSIS — M549 Dorsalgia, unspecified: Secondary | ICD-10-CM | POA: Diagnosis not present

## 2016-12-12 DIAGNOSIS — M549 Dorsalgia, unspecified: Secondary | ICD-10-CM | POA: Diagnosis not present

## 2016-12-13 DIAGNOSIS — M549 Dorsalgia, unspecified: Secondary | ICD-10-CM | POA: Diagnosis not present

## 2016-12-14 DIAGNOSIS — M549 Dorsalgia, unspecified: Secondary | ICD-10-CM | POA: Diagnosis not present

## 2016-12-15 DIAGNOSIS — M549 Dorsalgia, unspecified: Secondary | ICD-10-CM | POA: Diagnosis not present

## 2016-12-16 DIAGNOSIS — M549 Dorsalgia, unspecified: Secondary | ICD-10-CM | POA: Diagnosis not present

## 2016-12-17 DIAGNOSIS — M549 Dorsalgia, unspecified: Secondary | ICD-10-CM | POA: Diagnosis not present

## 2016-12-18 DIAGNOSIS — M549 Dorsalgia, unspecified: Secondary | ICD-10-CM | POA: Diagnosis not present

## 2016-12-19 DIAGNOSIS — M549 Dorsalgia, unspecified: Secondary | ICD-10-CM | POA: Diagnosis not present

## 2016-12-20 DIAGNOSIS — M549 Dorsalgia, unspecified: Secondary | ICD-10-CM | POA: Diagnosis not present

## 2016-12-21 DIAGNOSIS — M549 Dorsalgia, unspecified: Secondary | ICD-10-CM | POA: Diagnosis not present

## 2016-12-22 DIAGNOSIS — M549 Dorsalgia, unspecified: Secondary | ICD-10-CM | POA: Diagnosis not present

## 2016-12-23 DIAGNOSIS — M549 Dorsalgia, unspecified: Secondary | ICD-10-CM | POA: Diagnosis not present

## 2016-12-24 DIAGNOSIS — M549 Dorsalgia, unspecified: Secondary | ICD-10-CM | POA: Diagnosis not present

## 2016-12-25 DIAGNOSIS — M549 Dorsalgia, unspecified: Secondary | ICD-10-CM | POA: Diagnosis not present

## 2016-12-26 DIAGNOSIS — M549 Dorsalgia, unspecified: Secondary | ICD-10-CM | POA: Diagnosis not present

## 2016-12-27 DIAGNOSIS — M549 Dorsalgia, unspecified: Secondary | ICD-10-CM | POA: Diagnosis not present

## 2016-12-28 DIAGNOSIS — M549 Dorsalgia, unspecified: Secondary | ICD-10-CM | POA: Diagnosis not present

## 2016-12-29 DIAGNOSIS — M549 Dorsalgia, unspecified: Secondary | ICD-10-CM | POA: Diagnosis not present

## 2016-12-30 DIAGNOSIS — M549 Dorsalgia, unspecified: Secondary | ICD-10-CM | POA: Diagnosis not present

## 2016-12-31 DIAGNOSIS — M549 Dorsalgia, unspecified: Secondary | ICD-10-CM | POA: Diagnosis not present

## 2017-01-01 DIAGNOSIS — M549 Dorsalgia, unspecified: Secondary | ICD-10-CM | POA: Diagnosis not present

## 2017-01-02 DIAGNOSIS — M549 Dorsalgia, unspecified: Secondary | ICD-10-CM | POA: Diagnosis not present

## 2017-01-03 DIAGNOSIS — M549 Dorsalgia, unspecified: Secondary | ICD-10-CM | POA: Diagnosis not present

## 2017-01-04 DIAGNOSIS — M549 Dorsalgia, unspecified: Secondary | ICD-10-CM | POA: Diagnosis not present

## 2017-01-05 DIAGNOSIS — M549 Dorsalgia, unspecified: Secondary | ICD-10-CM | POA: Diagnosis not present

## 2017-01-06 DIAGNOSIS — M549 Dorsalgia, unspecified: Secondary | ICD-10-CM | POA: Diagnosis not present

## 2017-01-07 DIAGNOSIS — M549 Dorsalgia, unspecified: Secondary | ICD-10-CM | POA: Diagnosis not present

## 2017-01-08 DIAGNOSIS — M549 Dorsalgia, unspecified: Secondary | ICD-10-CM | POA: Diagnosis not present

## 2017-01-09 DIAGNOSIS — M549 Dorsalgia, unspecified: Secondary | ICD-10-CM | POA: Diagnosis not present

## 2017-01-10 DIAGNOSIS — M549 Dorsalgia, unspecified: Secondary | ICD-10-CM | POA: Diagnosis not present

## 2017-01-11 DIAGNOSIS — M549 Dorsalgia, unspecified: Secondary | ICD-10-CM | POA: Diagnosis not present

## 2017-01-12 DIAGNOSIS — M549 Dorsalgia, unspecified: Secondary | ICD-10-CM | POA: Diagnosis not present

## 2017-01-13 DIAGNOSIS — M549 Dorsalgia, unspecified: Secondary | ICD-10-CM | POA: Diagnosis not present

## 2017-01-14 DIAGNOSIS — M549 Dorsalgia, unspecified: Secondary | ICD-10-CM | POA: Diagnosis not present

## 2017-01-15 DIAGNOSIS — M549 Dorsalgia, unspecified: Secondary | ICD-10-CM | POA: Diagnosis not present

## 2017-01-16 DIAGNOSIS — M549 Dorsalgia, unspecified: Secondary | ICD-10-CM | POA: Diagnosis not present

## 2017-01-17 DIAGNOSIS — M549 Dorsalgia, unspecified: Secondary | ICD-10-CM | POA: Diagnosis not present

## 2017-01-18 DIAGNOSIS — M549 Dorsalgia, unspecified: Secondary | ICD-10-CM | POA: Diagnosis not present

## 2017-01-19 DIAGNOSIS — M549 Dorsalgia, unspecified: Secondary | ICD-10-CM | POA: Diagnosis not present

## 2017-01-20 DIAGNOSIS — M549 Dorsalgia, unspecified: Secondary | ICD-10-CM | POA: Diagnosis not present

## 2017-01-21 DIAGNOSIS — M549 Dorsalgia, unspecified: Secondary | ICD-10-CM | POA: Diagnosis not present

## 2017-01-22 DIAGNOSIS — M549 Dorsalgia, unspecified: Secondary | ICD-10-CM | POA: Diagnosis not present

## 2017-01-23 DIAGNOSIS — M549 Dorsalgia, unspecified: Secondary | ICD-10-CM | POA: Diagnosis not present

## 2017-01-24 DIAGNOSIS — M549 Dorsalgia, unspecified: Secondary | ICD-10-CM | POA: Diagnosis not present

## 2017-01-25 DIAGNOSIS — M549 Dorsalgia, unspecified: Secondary | ICD-10-CM | POA: Diagnosis not present

## 2017-01-26 DIAGNOSIS — M549 Dorsalgia, unspecified: Secondary | ICD-10-CM | POA: Diagnosis not present

## 2017-01-27 DIAGNOSIS — M549 Dorsalgia, unspecified: Secondary | ICD-10-CM | POA: Diagnosis not present

## 2017-01-27 DIAGNOSIS — G35 Multiple sclerosis: Secondary | ICD-10-CM | POA: Diagnosis not present

## 2017-01-28 DIAGNOSIS — M549 Dorsalgia, unspecified: Secondary | ICD-10-CM | POA: Diagnosis not present

## 2017-01-29 DIAGNOSIS — M549 Dorsalgia, unspecified: Secondary | ICD-10-CM | POA: Diagnosis not present

## 2017-01-30 DIAGNOSIS — M549 Dorsalgia, unspecified: Secondary | ICD-10-CM | POA: Diagnosis not present

## 2017-01-31 DIAGNOSIS — M549 Dorsalgia, unspecified: Secondary | ICD-10-CM | POA: Diagnosis not present

## 2017-02-01 DIAGNOSIS — M549 Dorsalgia, unspecified: Secondary | ICD-10-CM | POA: Diagnosis not present

## 2017-02-02 DIAGNOSIS — M549 Dorsalgia, unspecified: Secondary | ICD-10-CM | POA: Diagnosis not present

## 2017-02-03 DIAGNOSIS — M549 Dorsalgia, unspecified: Secondary | ICD-10-CM | POA: Diagnosis not present

## 2017-02-04 DIAGNOSIS — M549 Dorsalgia, unspecified: Secondary | ICD-10-CM | POA: Diagnosis not present

## 2017-02-05 DIAGNOSIS — M549 Dorsalgia, unspecified: Secondary | ICD-10-CM | POA: Diagnosis not present

## 2017-02-06 DIAGNOSIS — M549 Dorsalgia, unspecified: Secondary | ICD-10-CM | POA: Diagnosis not present

## 2017-02-07 DIAGNOSIS — M549 Dorsalgia, unspecified: Secondary | ICD-10-CM | POA: Diagnosis not present

## 2017-02-08 DIAGNOSIS — M549 Dorsalgia, unspecified: Secondary | ICD-10-CM | POA: Diagnosis not present

## 2017-02-09 DIAGNOSIS — M549 Dorsalgia, unspecified: Secondary | ICD-10-CM | POA: Diagnosis not present

## 2017-02-10 DIAGNOSIS — M549 Dorsalgia, unspecified: Secondary | ICD-10-CM | POA: Diagnosis not present

## 2017-02-11 DIAGNOSIS — M549 Dorsalgia, unspecified: Secondary | ICD-10-CM | POA: Diagnosis not present

## 2017-02-11 DIAGNOSIS — G35 Multiple sclerosis: Secondary | ICD-10-CM | POA: Diagnosis not present

## 2017-02-12 DIAGNOSIS — M549 Dorsalgia, unspecified: Secondary | ICD-10-CM | POA: Diagnosis not present

## 2017-02-13 DIAGNOSIS — M549 Dorsalgia, unspecified: Secondary | ICD-10-CM | POA: Diagnosis not present

## 2017-02-14 DIAGNOSIS — M549 Dorsalgia, unspecified: Secondary | ICD-10-CM | POA: Diagnosis not present

## 2017-02-15 DIAGNOSIS — M549 Dorsalgia, unspecified: Secondary | ICD-10-CM | POA: Diagnosis not present

## 2017-02-16 DIAGNOSIS — M549 Dorsalgia, unspecified: Secondary | ICD-10-CM | POA: Diagnosis not present

## 2017-02-17 DIAGNOSIS — M549 Dorsalgia, unspecified: Secondary | ICD-10-CM | POA: Diagnosis not present

## 2017-02-18 DIAGNOSIS — M549 Dorsalgia, unspecified: Secondary | ICD-10-CM | POA: Diagnosis not present

## 2017-02-19 DIAGNOSIS — M549 Dorsalgia, unspecified: Secondary | ICD-10-CM | POA: Diagnosis not present

## 2017-02-20 DIAGNOSIS — M549 Dorsalgia, unspecified: Secondary | ICD-10-CM | POA: Diagnosis not present

## 2017-02-21 DIAGNOSIS — M549 Dorsalgia, unspecified: Secondary | ICD-10-CM | POA: Diagnosis not present

## 2017-02-22 DIAGNOSIS — M549 Dorsalgia, unspecified: Secondary | ICD-10-CM | POA: Diagnosis not present

## 2017-02-23 DIAGNOSIS — M549 Dorsalgia, unspecified: Secondary | ICD-10-CM | POA: Diagnosis not present

## 2017-02-24 DIAGNOSIS — M549 Dorsalgia, unspecified: Secondary | ICD-10-CM | POA: Diagnosis not present

## 2017-02-25 DIAGNOSIS — M549 Dorsalgia, unspecified: Secondary | ICD-10-CM | POA: Diagnosis not present

## 2017-02-26 DIAGNOSIS — M549 Dorsalgia, unspecified: Secondary | ICD-10-CM | POA: Diagnosis not present

## 2017-02-27 DIAGNOSIS — M549 Dorsalgia, unspecified: Secondary | ICD-10-CM | POA: Diagnosis not present

## 2017-02-28 DIAGNOSIS — M549 Dorsalgia, unspecified: Secondary | ICD-10-CM | POA: Diagnosis not present

## 2017-03-01 DIAGNOSIS — M549 Dorsalgia, unspecified: Secondary | ICD-10-CM | POA: Diagnosis not present

## 2017-03-02 DIAGNOSIS — M549 Dorsalgia, unspecified: Secondary | ICD-10-CM | POA: Diagnosis not present

## 2017-03-03 DIAGNOSIS — M549 Dorsalgia, unspecified: Secondary | ICD-10-CM | POA: Diagnosis not present

## 2017-03-04 DIAGNOSIS — M549 Dorsalgia, unspecified: Secondary | ICD-10-CM | POA: Diagnosis not present

## 2017-03-05 DIAGNOSIS — M549 Dorsalgia, unspecified: Secondary | ICD-10-CM | POA: Diagnosis not present

## 2017-03-06 DIAGNOSIS — M549 Dorsalgia, unspecified: Secondary | ICD-10-CM | POA: Diagnosis not present

## 2017-03-07 DIAGNOSIS — M549 Dorsalgia, unspecified: Secondary | ICD-10-CM | POA: Diagnosis not present

## 2017-03-08 DIAGNOSIS — M549 Dorsalgia, unspecified: Secondary | ICD-10-CM | POA: Diagnosis not present

## 2017-03-09 DIAGNOSIS — M549 Dorsalgia, unspecified: Secondary | ICD-10-CM | POA: Diagnosis not present

## 2017-03-10 DIAGNOSIS — M549 Dorsalgia, unspecified: Secondary | ICD-10-CM | POA: Diagnosis not present

## 2017-03-11 DIAGNOSIS — M549 Dorsalgia, unspecified: Secondary | ICD-10-CM | POA: Diagnosis not present

## 2017-03-12 DIAGNOSIS — M549 Dorsalgia, unspecified: Secondary | ICD-10-CM | POA: Diagnosis not present

## 2017-03-13 DIAGNOSIS — M549 Dorsalgia, unspecified: Secondary | ICD-10-CM | POA: Diagnosis not present

## 2017-03-14 DIAGNOSIS — M549 Dorsalgia, unspecified: Secondary | ICD-10-CM | POA: Diagnosis not present

## 2017-03-15 DIAGNOSIS — M549 Dorsalgia, unspecified: Secondary | ICD-10-CM | POA: Diagnosis not present

## 2017-03-16 DIAGNOSIS — M549 Dorsalgia, unspecified: Secondary | ICD-10-CM | POA: Diagnosis not present

## 2017-03-17 DIAGNOSIS — M549 Dorsalgia, unspecified: Secondary | ICD-10-CM | POA: Diagnosis not present

## 2017-03-18 DIAGNOSIS — M549 Dorsalgia, unspecified: Secondary | ICD-10-CM | POA: Diagnosis not present

## 2017-03-19 DIAGNOSIS — M549 Dorsalgia, unspecified: Secondary | ICD-10-CM | POA: Diagnosis not present

## 2017-03-20 DIAGNOSIS — M549 Dorsalgia, unspecified: Secondary | ICD-10-CM | POA: Diagnosis not present

## 2017-03-21 DIAGNOSIS — M549 Dorsalgia, unspecified: Secondary | ICD-10-CM | POA: Diagnosis not present

## 2017-03-22 DIAGNOSIS — M549 Dorsalgia, unspecified: Secondary | ICD-10-CM | POA: Diagnosis not present

## 2017-03-23 DIAGNOSIS — M549 Dorsalgia, unspecified: Secondary | ICD-10-CM | POA: Diagnosis not present

## 2017-03-24 DIAGNOSIS — M549 Dorsalgia, unspecified: Secondary | ICD-10-CM | POA: Diagnosis not present

## 2017-03-25 DIAGNOSIS — M549 Dorsalgia, unspecified: Secondary | ICD-10-CM | POA: Diagnosis not present

## 2017-03-26 DIAGNOSIS — M549 Dorsalgia, unspecified: Secondary | ICD-10-CM | POA: Diagnosis not present

## 2017-03-27 DIAGNOSIS — M549 Dorsalgia, unspecified: Secondary | ICD-10-CM | POA: Diagnosis not present

## 2017-03-28 DIAGNOSIS — M549 Dorsalgia, unspecified: Secondary | ICD-10-CM | POA: Diagnosis not present

## 2017-03-29 DIAGNOSIS — M549 Dorsalgia, unspecified: Secondary | ICD-10-CM | POA: Diagnosis not present

## 2017-03-30 DIAGNOSIS — M549 Dorsalgia, unspecified: Secondary | ICD-10-CM | POA: Diagnosis not present

## 2017-03-31 DIAGNOSIS — M549 Dorsalgia, unspecified: Secondary | ICD-10-CM | POA: Diagnosis not present

## 2017-04-01 DIAGNOSIS — M549 Dorsalgia, unspecified: Secondary | ICD-10-CM | POA: Diagnosis not present

## 2017-04-02 DIAGNOSIS — M549 Dorsalgia, unspecified: Secondary | ICD-10-CM | POA: Diagnosis not present

## 2017-04-03 DIAGNOSIS — M549 Dorsalgia, unspecified: Secondary | ICD-10-CM | POA: Diagnosis not present

## 2017-04-04 DIAGNOSIS — M549 Dorsalgia, unspecified: Secondary | ICD-10-CM | POA: Diagnosis not present

## 2017-04-05 DIAGNOSIS — M549 Dorsalgia, unspecified: Secondary | ICD-10-CM | POA: Diagnosis not present

## 2017-04-06 DIAGNOSIS — M549 Dorsalgia, unspecified: Secondary | ICD-10-CM | POA: Diagnosis not present

## 2017-04-07 DIAGNOSIS — M549 Dorsalgia, unspecified: Secondary | ICD-10-CM | POA: Diagnosis not present

## 2017-04-08 DIAGNOSIS — E559 Vitamin D deficiency, unspecified: Secondary | ICD-10-CM | POA: Diagnosis not present

## 2017-04-08 DIAGNOSIS — Z125 Encounter for screening for malignant neoplasm of prostate: Secondary | ICD-10-CM | POA: Diagnosis not present

## 2017-04-08 DIAGNOSIS — M549 Dorsalgia, unspecified: Secondary | ICD-10-CM | POA: Diagnosis not present

## 2017-04-08 DIAGNOSIS — I10 Essential (primary) hypertension: Secondary | ICD-10-CM | POA: Diagnosis not present

## 2017-04-08 DIAGNOSIS — E784 Other hyperlipidemia: Secondary | ICD-10-CM | POA: Diagnosis not present

## 2017-04-09 DIAGNOSIS — M549 Dorsalgia, unspecified: Secondary | ICD-10-CM | POA: Diagnosis not present

## 2017-04-10 DIAGNOSIS — M549 Dorsalgia, unspecified: Secondary | ICD-10-CM | POA: Diagnosis not present

## 2017-04-11 DIAGNOSIS — M549 Dorsalgia, unspecified: Secondary | ICD-10-CM | POA: Diagnosis not present

## 2017-04-12 DIAGNOSIS — M549 Dorsalgia, unspecified: Secondary | ICD-10-CM | POA: Diagnosis not present

## 2017-04-13 DIAGNOSIS — M549 Dorsalgia, unspecified: Secondary | ICD-10-CM | POA: Diagnosis not present

## 2017-04-14 DIAGNOSIS — Z0000 Encounter for general adult medical examination without abnormal findings: Secondary | ICD-10-CM | POA: Diagnosis not present

## 2017-04-14 DIAGNOSIS — M549 Dorsalgia, unspecified: Secondary | ICD-10-CM | POA: Diagnosis not present

## 2017-04-14 DIAGNOSIS — R0789 Other chest pain: Secondary | ICD-10-CM | POA: Diagnosis not present

## 2017-04-14 DIAGNOSIS — E559 Vitamin D deficiency, unspecified: Secondary | ICD-10-CM | POA: Diagnosis not present

## 2017-04-14 DIAGNOSIS — I10 Essential (primary) hypertension: Secondary | ICD-10-CM | POA: Diagnosis not present

## 2017-04-14 DIAGNOSIS — Z1389 Encounter for screening for other disorder: Secondary | ICD-10-CM | POA: Diagnosis not present

## 2017-04-14 DIAGNOSIS — Z6820 Body mass index (BMI) 20.0-20.9, adult: Secondary | ICD-10-CM | POA: Diagnosis not present

## 2017-04-14 DIAGNOSIS — G35 Multiple sclerosis: Secondary | ICD-10-CM | POA: Diagnosis not present

## 2017-04-14 DIAGNOSIS — E784 Other hyperlipidemia: Secondary | ICD-10-CM | POA: Diagnosis not present

## 2017-04-14 DIAGNOSIS — G4733 Obstructive sleep apnea (adult) (pediatric): Secondary | ICD-10-CM | POA: Diagnosis not present

## 2017-04-15 DIAGNOSIS — M549 Dorsalgia, unspecified: Secondary | ICD-10-CM | POA: Diagnosis not present

## 2017-04-16 DIAGNOSIS — M549 Dorsalgia, unspecified: Secondary | ICD-10-CM | POA: Diagnosis not present

## 2017-04-17 DIAGNOSIS — M549 Dorsalgia, unspecified: Secondary | ICD-10-CM | POA: Diagnosis not present

## 2017-04-18 DIAGNOSIS — M549 Dorsalgia, unspecified: Secondary | ICD-10-CM | POA: Diagnosis not present

## 2017-04-19 DIAGNOSIS — M549 Dorsalgia, unspecified: Secondary | ICD-10-CM | POA: Diagnosis not present

## 2017-04-20 DIAGNOSIS — M549 Dorsalgia, unspecified: Secondary | ICD-10-CM | POA: Diagnosis not present

## 2017-04-21 DIAGNOSIS — M549 Dorsalgia, unspecified: Secondary | ICD-10-CM | POA: Diagnosis not present

## 2017-04-22 DIAGNOSIS — M549 Dorsalgia, unspecified: Secondary | ICD-10-CM | POA: Diagnosis not present

## 2017-04-23 DIAGNOSIS — M549 Dorsalgia, unspecified: Secondary | ICD-10-CM | POA: Diagnosis not present

## 2017-04-24 DIAGNOSIS — M549 Dorsalgia, unspecified: Secondary | ICD-10-CM | POA: Diagnosis not present

## 2017-04-25 DIAGNOSIS — M549 Dorsalgia, unspecified: Secondary | ICD-10-CM | POA: Diagnosis not present

## 2017-04-26 DIAGNOSIS — M549 Dorsalgia, unspecified: Secondary | ICD-10-CM | POA: Diagnosis not present

## 2017-04-27 DIAGNOSIS — M549 Dorsalgia, unspecified: Secondary | ICD-10-CM | POA: Diagnosis not present

## 2017-04-28 DIAGNOSIS — M549 Dorsalgia, unspecified: Secondary | ICD-10-CM | POA: Diagnosis not present

## 2017-04-29 DIAGNOSIS — M549 Dorsalgia, unspecified: Secondary | ICD-10-CM | POA: Diagnosis not present

## 2017-04-30 DIAGNOSIS — M549 Dorsalgia, unspecified: Secondary | ICD-10-CM | POA: Diagnosis not present

## 2017-05-01 DIAGNOSIS — M549 Dorsalgia, unspecified: Secondary | ICD-10-CM | POA: Diagnosis not present

## 2017-05-02 DIAGNOSIS — M549 Dorsalgia, unspecified: Secondary | ICD-10-CM | POA: Diagnosis not present

## 2017-05-03 DIAGNOSIS — M549 Dorsalgia, unspecified: Secondary | ICD-10-CM | POA: Diagnosis not present

## 2017-05-04 DIAGNOSIS — M549 Dorsalgia, unspecified: Secondary | ICD-10-CM | POA: Diagnosis not present

## 2017-05-05 DIAGNOSIS — M549 Dorsalgia, unspecified: Secondary | ICD-10-CM | POA: Diagnosis not present

## 2017-05-06 DIAGNOSIS — M549 Dorsalgia, unspecified: Secondary | ICD-10-CM | POA: Diagnosis not present

## 2017-05-07 ENCOUNTER — Other Ambulatory Visit: Payer: Self-pay | Admitting: Cardiology

## 2017-05-07 DIAGNOSIS — M549 Dorsalgia, unspecified: Secondary | ICD-10-CM | POA: Diagnosis not present

## 2017-05-07 DIAGNOSIS — E785 Hyperlipidemia, unspecified: Secondary | ICD-10-CM

## 2017-05-07 DIAGNOSIS — I10 Essential (primary) hypertension: Secondary | ICD-10-CM | POA: Diagnosis not present

## 2017-05-07 DIAGNOSIS — R0789 Other chest pain: Secondary | ICD-10-CM | POA: Diagnosis not present

## 2017-05-07 DIAGNOSIS — Z0189 Encounter for other specified special examinations: Secondary | ICD-10-CM | POA: Diagnosis not present

## 2017-05-08 DIAGNOSIS — M549 Dorsalgia, unspecified: Secondary | ICD-10-CM | POA: Diagnosis not present

## 2017-05-09 DIAGNOSIS — M549 Dorsalgia, unspecified: Secondary | ICD-10-CM | POA: Diagnosis not present

## 2017-05-10 DIAGNOSIS — M549 Dorsalgia, unspecified: Secondary | ICD-10-CM | POA: Diagnosis not present

## 2017-05-11 DIAGNOSIS — M549 Dorsalgia, unspecified: Secondary | ICD-10-CM | POA: Diagnosis not present

## 2017-05-12 DIAGNOSIS — M549 Dorsalgia, unspecified: Secondary | ICD-10-CM | POA: Diagnosis not present

## 2017-05-13 ENCOUNTER — Ambulatory Visit (HOSPITAL_COMMUNITY)
Admission: RE | Admit: 2017-05-13 | Discharge: 2017-05-13 | Disposition: A | Discharge status: Home / Self Care | Payer: Medicare HMO | Source: Ambulatory Visit | Attending: Cardiology | Admitting: Cardiology

## 2017-05-13 DIAGNOSIS — M549 Dorsalgia, unspecified: Secondary | ICD-10-CM | POA: Diagnosis not present

## 2017-05-14 DIAGNOSIS — M549 Dorsalgia, unspecified: Secondary | ICD-10-CM | POA: Diagnosis not present

## 2017-05-15 DIAGNOSIS — M549 Dorsalgia, unspecified: Secondary | ICD-10-CM | POA: Diagnosis not present

## 2017-05-16 DIAGNOSIS — M549 Dorsalgia, unspecified: Secondary | ICD-10-CM | POA: Diagnosis not present

## 2017-05-17 DIAGNOSIS — M549 Dorsalgia, unspecified: Secondary | ICD-10-CM | POA: Diagnosis not present

## 2017-05-18 DIAGNOSIS — M549 Dorsalgia, unspecified: Secondary | ICD-10-CM | POA: Diagnosis not present

## 2017-05-19 DIAGNOSIS — M549 Dorsalgia, unspecified: Secondary | ICD-10-CM | POA: Diagnosis not present

## 2017-05-20 ENCOUNTER — Encounter (HOSPITAL_COMMUNITY): Payer: Self-pay

## 2017-05-20 ENCOUNTER — Ambulatory Visit (HOSPITAL_COMMUNITY)
Admission: RE | Admit: 2017-05-20 | Discharge: 2017-05-20 | Disposition: A | Discharge status: Home / Self Care | Payer: Medicare HMO | Source: Ambulatory Visit | Attending: Cardiology | Admitting: Cardiology

## 2017-05-20 DIAGNOSIS — I2510 Atherosclerotic heart disease of native coronary artery without angina pectoris: Secondary | ICD-10-CM | POA: Diagnosis not present

## 2017-05-20 DIAGNOSIS — E785 Hyperlipidemia, unspecified: Secondary | ICD-10-CM | POA: Diagnosis not present

## 2017-05-20 DIAGNOSIS — I10 Essential (primary) hypertension: Secondary | ICD-10-CM | POA: Diagnosis not present

## 2017-05-20 DIAGNOSIS — R0789 Other chest pain: Secondary | ICD-10-CM | POA: Diagnosis not present

## 2017-05-20 DIAGNOSIS — M549 Dorsalgia, unspecified: Secondary | ICD-10-CM | POA: Diagnosis not present

## 2017-05-20 MED ORDER — IOPAMIDOL (ISOVUE-370) INJECTION 76%
INTRAVENOUS | Status: AC
  Administered 2017-05-20: 80 mL
  Filled 2017-05-20: qty 100

## 2017-05-20 MED ORDER — METOPROLOL TARTRATE 100 MG PO TABS
100.0000 mg | ORAL_TABLET | Freq: Once | ORAL | Status: AC
  Administered 2017-05-20: 100 mg via ORAL
  Filled 2017-05-20: qty 1

## 2017-05-20 MED ORDER — NITROGLYCERIN 0.4 MG SL SUBL: 0.4000 mg | SUBLINGUAL_TABLET | Freq: Once | SUBLINGUAL | Status: AC

## 2017-05-20 MED ORDER — NITROGLYCERIN 0.4 MG SL SUBL
SUBLINGUAL_TABLET | SUBLINGUAL | Status: AC
  Administered 2017-05-20: 0.4 mg via SUBLINGUAL
  Filled 2017-05-20: qty 1

## 2017-05-20 MED ORDER — METOPROLOL TARTRATE 100 MG PO TABS
100.0000 mg | ORAL_TABLET | ORAL | Status: AC
  Administered 2017-05-20: 100 mg via ORAL
  Filled 2017-05-20: qty 1

## 2017-05-20 MED ORDER — METOPROLOL TARTRATE 5 MG/5ML IV SOLN
INTRAVENOUS | Status: AC
  Filled 2017-05-20: qty 5

## 2017-05-20 NOTE — Progress Notes (Signed)
Is on 2 muscle relaxants and BP med and other meds. Will bring in list next visit so meds can be documented

## 2017-05-21 DIAGNOSIS — M549 Dorsalgia, unspecified: Secondary | ICD-10-CM | POA: Diagnosis not present

## 2017-05-22 ENCOUNTER — Ambulatory Visit (HOSPITAL_COMMUNITY): Payer: Medicare HMO

## 2017-05-22 DIAGNOSIS — M549 Dorsalgia, unspecified: Secondary | ICD-10-CM | POA: Diagnosis not present

## 2017-05-23 DIAGNOSIS — M549 Dorsalgia, unspecified: Secondary | ICD-10-CM | POA: Diagnosis not present

## 2017-05-24 DIAGNOSIS — M549 Dorsalgia, unspecified: Secondary | ICD-10-CM | POA: Diagnosis not present

## 2017-05-25 DIAGNOSIS — M549 Dorsalgia, unspecified: Secondary | ICD-10-CM | POA: Diagnosis not present

## 2017-05-26 DIAGNOSIS — M549 Dorsalgia, unspecified: Secondary | ICD-10-CM | POA: Diagnosis not present

## 2017-05-27 DIAGNOSIS — M549 Dorsalgia, unspecified: Secondary | ICD-10-CM | POA: Diagnosis not present

## 2017-05-28 DIAGNOSIS — M549 Dorsalgia, unspecified: Secondary | ICD-10-CM | POA: Diagnosis not present

## 2017-05-29 DIAGNOSIS — M549 Dorsalgia, unspecified: Secondary | ICD-10-CM | POA: Diagnosis not present

## 2017-05-30 DIAGNOSIS — M549 Dorsalgia, unspecified: Secondary | ICD-10-CM | POA: Diagnosis not present

## 2017-05-31 DIAGNOSIS — M549 Dorsalgia, unspecified: Secondary | ICD-10-CM | POA: Diagnosis not present

## 2017-06-01 DIAGNOSIS — M549 Dorsalgia, unspecified: Secondary | ICD-10-CM | POA: Diagnosis not present

## 2017-06-02 DIAGNOSIS — M549 Dorsalgia, unspecified: Secondary | ICD-10-CM | POA: Diagnosis not present

## 2017-06-03 DIAGNOSIS — M549 Dorsalgia, unspecified: Secondary | ICD-10-CM | POA: Diagnosis not present

## 2017-06-04 DIAGNOSIS — M549 Dorsalgia, unspecified: Secondary | ICD-10-CM | POA: Diagnosis not present

## 2017-06-05 DIAGNOSIS — M549 Dorsalgia, unspecified: Secondary | ICD-10-CM | POA: Diagnosis not present

## 2017-06-06 DIAGNOSIS — M549 Dorsalgia, unspecified: Secondary | ICD-10-CM | POA: Diagnosis not present

## 2017-06-07 DIAGNOSIS — M549 Dorsalgia, unspecified: Secondary | ICD-10-CM | POA: Diagnosis not present

## 2017-06-08 DIAGNOSIS — M549 Dorsalgia, unspecified: Secondary | ICD-10-CM | POA: Diagnosis not present

## 2017-06-09 DIAGNOSIS — M549 Dorsalgia, unspecified: Secondary | ICD-10-CM | POA: Diagnosis not present

## 2017-06-10 DIAGNOSIS — E785 Hyperlipidemia, unspecified: Secondary | ICD-10-CM | POA: Diagnosis not present

## 2017-06-10 DIAGNOSIS — G4733 Obstructive sleep apnea (adult) (pediatric): Secondary | ICD-10-CM | POA: Diagnosis not present

## 2017-06-10 DIAGNOSIS — R0789 Other chest pain: Secondary | ICD-10-CM | POA: Diagnosis not present

## 2017-06-10 DIAGNOSIS — I10 Essential (primary) hypertension: Secondary | ICD-10-CM | POA: Diagnosis not present

## 2017-06-10 DIAGNOSIS — M549 Dorsalgia, unspecified: Secondary | ICD-10-CM | POA: Diagnosis not present

## 2017-06-11 DIAGNOSIS — M549 Dorsalgia, unspecified: Secondary | ICD-10-CM | POA: Diagnosis not present

## 2017-06-12 DIAGNOSIS — M549 Dorsalgia, unspecified: Secondary | ICD-10-CM | POA: Diagnosis not present

## 2017-06-13 DIAGNOSIS — M549 Dorsalgia, unspecified: Secondary | ICD-10-CM | POA: Diagnosis not present

## 2017-06-14 DIAGNOSIS — M549 Dorsalgia, unspecified: Secondary | ICD-10-CM | POA: Diagnosis not present

## 2017-06-15 DIAGNOSIS — M549 Dorsalgia, unspecified: Secondary | ICD-10-CM | POA: Diagnosis not present

## 2017-06-16 DIAGNOSIS — M549 Dorsalgia, unspecified: Secondary | ICD-10-CM | POA: Diagnosis not present

## 2017-06-17 DIAGNOSIS — M549 Dorsalgia, unspecified: Secondary | ICD-10-CM | POA: Diagnosis not present

## 2017-06-18 DIAGNOSIS — M549 Dorsalgia, unspecified: Secondary | ICD-10-CM | POA: Diagnosis not present

## 2017-06-19 DIAGNOSIS — M549 Dorsalgia, unspecified: Secondary | ICD-10-CM | POA: Diagnosis not present

## 2017-06-20 DIAGNOSIS — M549 Dorsalgia, unspecified: Secondary | ICD-10-CM | POA: Diagnosis not present

## 2017-06-21 DIAGNOSIS — M549 Dorsalgia, unspecified: Secondary | ICD-10-CM | POA: Diagnosis not present

## 2017-06-22 DIAGNOSIS — M549 Dorsalgia, unspecified: Secondary | ICD-10-CM | POA: Diagnosis not present

## 2017-06-23 DIAGNOSIS — M549 Dorsalgia, unspecified: Secondary | ICD-10-CM | POA: Diagnosis not present

## 2017-06-24 DIAGNOSIS — M549 Dorsalgia, unspecified: Secondary | ICD-10-CM | POA: Diagnosis not present

## 2017-06-25 DIAGNOSIS — M549 Dorsalgia, unspecified: Secondary | ICD-10-CM | POA: Diagnosis not present

## 2017-06-26 DIAGNOSIS — M549 Dorsalgia, unspecified: Secondary | ICD-10-CM | POA: Diagnosis not present

## 2017-06-27 DIAGNOSIS — M549 Dorsalgia, unspecified: Secondary | ICD-10-CM | POA: Diagnosis not present

## 2017-06-28 DIAGNOSIS — M549 Dorsalgia, unspecified: Secondary | ICD-10-CM | POA: Diagnosis not present

## 2017-06-29 DIAGNOSIS — M549 Dorsalgia, unspecified: Secondary | ICD-10-CM | POA: Diagnosis not present

## 2017-06-30 DIAGNOSIS — M549 Dorsalgia, unspecified: Secondary | ICD-10-CM | POA: Diagnosis not present

## 2017-07-01 DIAGNOSIS — M549 Dorsalgia, unspecified: Secondary | ICD-10-CM | POA: Diagnosis not present

## 2017-07-02 DIAGNOSIS — M549 Dorsalgia, unspecified: Secondary | ICD-10-CM | POA: Diagnosis not present

## 2017-07-03 DIAGNOSIS — M549 Dorsalgia, unspecified: Secondary | ICD-10-CM | POA: Diagnosis not present

## 2017-07-04 DIAGNOSIS — M549 Dorsalgia, unspecified: Secondary | ICD-10-CM | POA: Diagnosis not present

## 2017-07-05 DIAGNOSIS — M549 Dorsalgia, unspecified: Secondary | ICD-10-CM | POA: Diagnosis not present

## 2017-07-06 DIAGNOSIS — M549 Dorsalgia, unspecified: Secondary | ICD-10-CM | POA: Diagnosis not present

## 2017-07-07 DIAGNOSIS — M549 Dorsalgia, unspecified: Secondary | ICD-10-CM | POA: Diagnosis not present

## 2017-07-08 DIAGNOSIS — M549 Dorsalgia, unspecified: Secondary | ICD-10-CM | POA: Diagnosis not present

## 2017-07-09 DIAGNOSIS — M549 Dorsalgia, unspecified: Secondary | ICD-10-CM | POA: Diagnosis not present

## 2017-07-10 DIAGNOSIS — M549 Dorsalgia, unspecified: Secondary | ICD-10-CM | POA: Diagnosis not present

## 2017-07-11 DIAGNOSIS — M549 Dorsalgia, unspecified: Secondary | ICD-10-CM | POA: Diagnosis not present

## 2017-07-12 DIAGNOSIS — M549 Dorsalgia, unspecified: Secondary | ICD-10-CM | POA: Diagnosis not present

## 2017-07-13 DIAGNOSIS — M549 Dorsalgia, unspecified: Secondary | ICD-10-CM | POA: Diagnosis not present

## 2017-07-14 DIAGNOSIS — M549 Dorsalgia, unspecified: Secondary | ICD-10-CM | POA: Diagnosis not present

## 2017-07-15 DIAGNOSIS — M549 Dorsalgia, unspecified: Secondary | ICD-10-CM | POA: Diagnosis not present

## 2017-07-16 DIAGNOSIS — M549 Dorsalgia, unspecified: Secondary | ICD-10-CM | POA: Diagnosis not present

## 2017-07-17 DIAGNOSIS — M549 Dorsalgia, unspecified: Secondary | ICD-10-CM | POA: Diagnosis not present

## 2017-07-18 DIAGNOSIS — M549 Dorsalgia, unspecified: Secondary | ICD-10-CM | POA: Diagnosis not present

## 2017-07-19 DIAGNOSIS — M549 Dorsalgia, unspecified: Secondary | ICD-10-CM | POA: Diagnosis not present

## 2017-07-20 DIAGNOSIS — M549 Dorsalgia, unspecified: Secondary | ICD-10-CM | POA: Diagnosis not present

## 2017-07-21 DIAGNOSIS — M549 Dorsalgia, unspecified: Secondary | ICD-10-CM | POA: Diagnosis not present

## 2017-07-22 DIAGNOSIS — M549 Dorsalgia, unspecified: Secondary | ICD-10-CM | POA: Diagnosis not present

## 2017-07-23 DIAGNOSIS — M549 Dorsalgia, unspecified: Secondary | ICD-10-CM | POA: Diagnosis not present

## 2017-07-24 DIAGNOSIS — M549 Dorsalgia, unspecified: Secondary | ICD-10-CM | POA: Diagnosis not present

## 2017-07-25 DIAGNOSIS — M549 Dorsalgia, unspecified: Secondary | ICD-10-CM | POA: Diagnosis not present

## 2017-07-26 DIAGNOSIS — M549 Dorsalgia, unspecified: Secondary | ICD-10-CM | POA: Diagnosis not present

## 2017-07-27 DIAGNOSIS — M549 Dorsalgia, unspecified: Secondary | ICD-10-CM | POA: Diagnosis not present

## 2017-07-28 DIAGNOSIS — M549 Dorsalgia, unspecified: Secondary | ICD-10-CM | POA: Diagnosis not present

## 2017-07-29 DIAGNOSIS — M549 Dorsalgia, unspecified: Secondary | ICD-10-CM | POA: Diagnosis not present

## 2017-07-30 DIAGNOSIS — M549 Dorsalgia, unspecified: Secondary | ICD-10-CM | POA: Diagnosis not present

## 2017-07-31 DIAGNOSIS — M549 Dorsalgia, unspecified: Secondary | ICD-10-CM | POA: Diagnosis not present

## 2017-08-01 DIAGNOSIS — M549 Dorsalgia, unspecified: Secondary | ICD-10-CM | POA: Diagnosis not present

## 2017-08-02 DIAGNOSIS — M549 Dorsalgia, unspecified: Secondary | ICD-10-CM | POA: Diagnosis not present

## 2017-08-05 DIAGNOSIS — G35 Multiple sclerosis: Secondary | ICD-10-CM | POA: Diagnosis not present

## 2017-08-10 DIAGNOSIS — M549 Dorsalgia, unspecified: Secondary | ICD-10-CM | POA: Diagnosis not present

## 2017-08-11 DIAGNOSIS — M549 Dorsalgia, unspecified: Secondary | ICD-10-CM | POA: Diagnosis not present

## 2017-08-12 DIAGNOSIS — M5412 Radiculopathy, cervical region: Secondary | ICD-10-CM | POA: Diagnosis not present

## 2017-08-12 DIAGNOSIS — M549 Dorsalgia, unspecified: Secondary | ICD-10-CM | POA: Diagnosis not present

## 2017-08-12 DIAGNOSIS — M5417 Radiculopathy, lumbosacral region: Secondary | ICD-10-CM | POA: Diagnosis not present

## 2017-08-12 DIAGNOSIS — Z79899 Other long term (current) drug therapy: Secondary | ICD-10-CM | POA: Diagnosis not present

## 2017-08-12 DIAGNOSIS — R202 Paresthesia of skin: Secondary | ICD-10-CM | POA: Diagnosis not present

## 2017-08-12 DIAGNOSIS — G35 Multiple sclerosis: Secondary | ICD-10-CM | POA: Diagnosis not present

## 2017-08-13 DIAGNOSIS — M549 Dorsalgia, unspecified: Secondary | ICD-10-CM | POA: Diagnosis not present

## 2017-08-14 DIAGNOSIS — M549 Dorsalgia, unspecified: Secondary | ICD-10-CM | POA: Diagnosis not present

## 2017-08-15 DIAGNOSIS — M549 Dorsalgia, unspecified: Secondary | ICD-10-CM | POA: Diagnosis not present

## 2017-08-16 DIAGNOSIS — M549 Dorsalgia, unspecified: Secondary | ICD-10-CM | POA: Diagnosis not present

## 2017-08-17 DIAGNOSIS — M549 Dorsalgia, unspecified: Secondary | ICD-10-CM | POA: Diagnosis not present

## 2017-08-18 DIAGNOSIS — M549 Dorsalgia, unspecified: Secondary | ICD-10-CM | POA: Diagnosis not present

## 2017-08-19 DIAGNOSIS — M549 Dorsalgia, unspecified: Secondary | ICD-10-CM | POA: Diagnosis not present

## 2017-08-20 DIAGNOSIS — M549 Dorsalgia, unspecified: Secondary | ICD-10-CM | POA: Diagnosis not present

## 2017-08-21 DIAGNOSIS — M549 Dorsalgia, unspecified: Secondary | ICD-10-CM | POA: Diagnosis not present

## 2017-08-22 DIAGNOSIS — M549 Dorsalgia, unspecified: Secondary | ICD-10-CM | POA: Diagnosis not present

## 2017-08-23 DIAGNOSIS — M549 Dorsalgia, unspecified: Secondary | ICD-10-CM | POA: Diagnosis not present

## 2017-08-24 DIAGNOSIS — M549 Dorsalgia, unspecified: Secondary | ICD-10-CM | POA: Diagnosis not present

## 2017-08-25 DIAGNOSIS — M549 Dorsalgia, unspecified: Secondary | ICD-10-CM | POA: Diagnosis not present

## 2017-08-26 DIAGNOSIS — M549 Dorsalgia, unspecified: Secondary | ICD-10-CM | POA: Diagnosis not present

## 2017-08-27 DIAGNOSIS — M549 Dorsalgia, unspecified: Secondary | ICD-10-CM | POA: Diagnosis not present

## 2017-08-28 DIAGNOSIS — M549 Dorsalgia, unspecified: Secondary | ICD-10-CM | POA: Diagnosis not present

## 2017-08-29 DIAGNOSIS — M549 Dorsalgia, unspecified: Secondary | ICD-10-CM | POA: Diagnosis not present

## 2017-08-30 DIAGNOSIS — M549 Dorsalgia, unspecified: Secondary | ICD-10-CM | POA: Diagnosis not present

## 2017-08-31 DIAGNOSIS — M549 Dorsalgia, unspecified: Secondary | ICD-10-CM | POA: Diagnosis not present

## 2017-09-01 DIAGNOSIS — M549 Dorsalgia, unspecified: Secondary | ICD-10-CM | POA: Diagnosis not present

## 2017-09-02 DIAGNOSIS — M549 Dorsalgia, unspecified: Secondary | ICD-10-CM | POA: Diagnosis not present

## 2017-09-03 DIAGNOSIS — M549 Dorsalgia, unspecified: Secondary | ICD-10-CM | POA: Diagnosis not present

## 2017-09-04 DIAGNOSIS — M549 Dorsalgia, unspecified: Secondary | ICD-10-CM | POA: Diagnosis not present

## 2017-09-05 DIAGNOSIS — M549 Dorsalgia, unspecified: Secondary | ICD-10-CM | POA: Diagnosis not present

## 2017-09-06 DIAGNOSIS — M549 Dorsalgia, unspecified: Secondary | ICD-10-CM | POA: Diagnosis not present

## 2017-09-07 DIAGNOSIS — M549 Dorsalgia, unspecified: Secondary | ICD-10-CM | POA: Diagnosis not present

## 2017-09-08 DIAGNOSIS — M549 Dorsalgia, unspecified: Secondary | ICD-10-CM | POA: Diagnosis not present

## 2017-09-09 DIAGNOSIS — M549 Dorsalgia, unspecified: Secondary | ICD-10-CM | POA: Diagnosis not present

## 2017-09-10 DIAGNOSIS — M549 Dorsalgia, unspecified: Secondary | ICD-10-CM | POA: Diagnosis not present

## 2017-09-11 DIAGNOSIS — M549 Dorsalgia, unspecified: Secondary | ICD-10-CM | POA: Diagnosis not present

## 2017-09-12 DIAGNOSIS — M549 Dorsalgia, unspecified: Secondary | ICD-10-CM | POA: Diagnosis not present

## 2017-09-13 DIAGNOSIS — M549 Dorsalgia, unspecified: Secondary | ICD-10-CM | POA: Diagnosis not present

## 2017-09-14 DIAGNOSIS — M549 Dorsalgia, unspecified: Secondary | ICD-10-CM | POA: Diagnosis not present

## 2017-09-15 DIAGNOSIS — M549 Dorsalgia, unspecified: Secondary | ICD-10-CM | POA: Diagnosis not present

## 2017-09-16 DIAGNOSIS — M549 Dorsalgia, unspecified: Secondary | ICD-10-CM | POA: Diagnosis not present

## 2017-09-17 DIAGNOSIS — M549 Dorsalgia, unspecified: Secondary | ICD-10-CM | POA: Diagnosis not present

## 2017-09-18 DIAGNOSIS — M549 Dorsalgia, unspecified: Secondary | ICD-10-CM | POA: Diagnosis not present

## 2017-09-19 DIAGNOSIS — M549 Dorsalgia, unspecified: Secondary | ICD-10-CM | POA: Diagnosis not present

## 2017-09-20 DIAGNOSIS — M549 Dorsalgia, unspecified: Secondary | ICD-10-CM | POA: Diagnosis not present

## 2017-09-21 DIAGNOSIS — M549 Dorsalgia, unspecified: Secondary | ICD-10-CM | POA: Diagnosis not present

## 2017-09-22 DIAGNOSIS — M549 Dorsalgia, unspecified: Secondary | ICD-10-CM | POA: Diagnosis not present

## 2017-09-23 DIAGNOSIS — M549 Dorsalgia, unspecified: Secondary | ICD-10-CM | POA: Diagnosis not present

## 2017-09-24 DIAGNOSIS — M549 Dorsalgia, unspecified: Secondary | ICD-10-CM | POA: Diagnosis not present

## 2017-09-25 DIAGNOSIS — M549 Dorsalgia, unspecified: Secondary | ICD-10-CM | POA: Diagnosis not present

## 2017-09-26 DIAGNOSIS — M549 Dorsalgia, unspecified: Secondary | ICD-10-CM | POA: Diagnosis not present

## 2017-09-27 DIAGNOSIS — M549 Dorsalgia, unspecified: Secondary | ICD-10-CM | POA: Diagnosis not present

## 2017-09-28 DIAGNOSIS — M549 Dorsalgia, unspecified: Secondary | ICD-10-CM | POA: Diagnosis not present

## 2017-09-29 DIAGNOSIS — M549 Dorsalgia, unspecified: Secondary | ICD-10-CM | POA: Diagnosis not present

## 2017-09-30 DIAGNOSIS — M549 Dorsalgia, unspecified: Secondary | ICD-10-CM | POA: Diagnosis not present

## 2017-10-01 DIAGNOSIS — M549 Dorsalgia, unspecified: Secondary | ICD-10-CM | POA: Diagnosis not present

## 2017-10-02 DIAGNOSIS — M549 Dorsalgia, unspecified: Secondary | ICD-10-CM | POA: Diagnosis not present

## 2017-10-03 DIAGNOSIS — M549 Dorsalgia, unspecified: Secondary | ICD-10-CM | POA: Diagnosis not present

## 2017-10-04 DIAGNOSIS — M549 Dorsalgia, unspecified: Secondary | ICD-10-CM | POA: Diagnosis not present

## 2017-10-05 DIAGNOSIS — M549 Dorsalgia, unspecified: Secondary | ICD-10-CM | POA: Diagnosis not present

## 2017-10-06 DIAGNOSIS — M549 Dorsalgia, unspecified: Secondary | ICD-10-CM | POA: Diagnosis not present

## 2017-10-07 DIAGNOSIS — M549 Dorsalgia, unspecified: Secondary | ICD-10-CM | POA: Diagnosis not present

## 2017-10-08 DIAGNOSIS — M549 Dorsalgia, unspecified: Secondary | ICD-10-CM | POA: Diagnosis not present

## 2017-10-09 DIAGNOSIS — M549 Dorsalgia, unspecified: Secondary | ICD-10-CM | POA: Diagnosis not present

## 2017-10-10 DIAGNOSIS — M549 Dorsalgia, unspecified: Secondary | ICD-10-CM | POA: Diagnosis not present

## 2017-10-11 DIAGNOSIS — M549 Dorsalgia, unspecified: Secondary | ICD-10-CM | POA: Diagnosis not present

## 2017-10-12 DIAGNOSIS — M549 Dorsalgia, unspecified: Secondary | ICD-10-CM | POA: Diagnosis not present

## 2017-10-13 DIAGNOSIS — M549 Dorsalgia, unspecified: Secondary | ICD-10-CM | POA: Diagnosis not present

## 2017-10-14 DIAGNOSIS — M549 Dorsalgia, unspecified: Secondary | ICD-10-CM | POA: Diagnosis not present

## 2017-10-15 DIAGNOSIS — M549 Dorsalgia, unspecified: Secondary | ICD-10-CM | POA: Diagnosis not present

## 2017-10-16 DIAGNOSIS — M549 Dorsalgia, unspecified: Secondary | ICD-10-CM | POA: Diagnosis not present

## 2017-10-17 DIAGNOSIS — M549 Dorsalgia, unspecified: Secondary | ICD-10-CM | POA: Diagnosis not present

## 2017-10-18 DIAGNOSIS — M549 Dorsalgia, unspecified: Secondary | ICD-10-CM | POA: Diagnosis not present

## 2017-10-19 DIAGNOSIS — M549 Dorsalgia, unspecified: Secondary | ICD-10-CM | POA: Diagnosis not present

## 2017-10-20 DIAGNOSIS — M549 Dorsalgia, unspecified: Secondary | ICD-10-CM | POA: Diagnosis not present

## 2017-10-21 DIAGNOSIS — M549 Dorsalgia, unspecified: Secondary | ICD-10-CM | POA: Diagnosis not present

## 2017-10-22 DIAGNOSIS — M549 Dorsalgia, unspecified: Secondary | ICD-10-CM | POA: Diagnosis not present

## 2017-10-23 DIAGNOSIS — M549 Dorsalgia, unspecified: Secondary | ICD-10-CM | POA: Diagnosis not present

## 2017-10-24 DIAGNOSIS — M549 Dorsalgia, unspecified: Secondary | ICD-10-CM | POA: Diagnosis not present

## 2017-10-25 DIAGNOSIS — M549 Dorsalgia, unspecified: Secondary | ICD-10-CM | POA: Diagnosis not present

## 2017-10-26 DIAGNOSIS — M549 Dorsalgia, unspecified: Secondary | ICD-10-CM | POA: Diagnosis not present

## 2017-10-27 DIAGNOSIS — M549 Dorsalgia, unspecified: Secondary | ICD-10-CM | POA: Diagnosis not present

## 2017-10-28 DIAGNOSIS — M549 Dorsalgia, unspecified: Secondary | ICD-10-CM | POA: Diagnosis not present

## 2017-10-29 DIAGNOSIS — M549 Dorsalgia, unspecified: Secondary | ICD-10-CM | POA: Diagnosis not present

## 2017-10-30 DIAGNOSIS — M549 Dorsalgia, unspecified: Secondary | ICD-10-CM | POA: Diagnosis not present

## 2017-10-31 DIAGNOSIS — M549 Dorsalgia, unspecified: Secondary | ICD-10-CM | POA: Diagnosis not present

## 2017-11-01 DIAGNOSIS — M549 Dorsalgia, unspecified: Secondary | ICD-10-CM | POA: Diagnosis not present

## 2017-11-02 DIAGNOSIS — M549 Dorsalgia, unspecified: Secondary | ICD-10-CM | POA: Diagnosis not present

## 2017-11-03 DIAGNOSIS — M549 Dorsalgia, unspecified: Secondary | ICD-10-CM | POA: Diagnosis not present

## 2017-11-04 DIAGNOSIS — M549 Dorsalgia, unspecified: Secondary | ICD-10-CM | POA: Diagnosis not present

## 2017-11-05 DIAGNOSIS — M549 Dorsalgia, unspecified: Secondary | ICD-10-CM | POA: Diagnosis not present

## 2017-11-06 DIAGNOSIS — M549 Dorsalgia, unspecified: Secondary | ICD-10-CM | POA: Diagnosis not present

## 2017-11-07 DIAGNOSIS — M549 Dorsalgia, unspecified: Secondary | ICD-10-CM | POA: Diagnosis not present

## 2017-11-08 DIAGNOSIS — M549 Dorsalgia, unspecified: Secondary | ICD-10-CM | POA: Diagnosis not present

## 2017-11-09 DIAGNOSIS — M549 Dorsalgia, unspecified: Secondary | ICD-10-CM | POA: Diagnosis not present

## 2017-11-10 DIAGNOSIS — M549 Dorsalgia, unspecified: Secondary | ICD-10-CM | POA: Diagnosis not present

## 2017-11-11 DIAGNOSIS — M549 Dorsalgia, unspecified: Secondary | ICD-10-CM | POA: Diagnosis not present

## 2017-11-12 DIAGNOSIS — M549 Dorsalgia, unspecified: Secondary | ICD-10-CM | POA: Diagnosis not present

## 2017-11-13 DIAGNOSIS — M549 Dorsalgia, unspecified: Secondary | ICD-10-CM | POA: Diagnosis not present

## 2017-11-14 DIAGNOSIS — M549 Dorsalgia, unspecified: Secondary | ICD-10-CM | POA: Diagnosis not present

## 2017-11-15 DIAGNOSIS — M549 Dorsalgia, unspecified: Secondary | ICD-10-CM | POA: Diagnosis not present

## 2017-11-16 DIAGNOSIS — M549 Dorsalgia, unspecified: Secondary | ICD-10-CM | POA: Diagnosis not present

## 2017-11-17 DIAGNOSIS — M549 Dorsalgia, unspecified: Secondary | ICD-10-CM | POA: Diagnosis not present

## 2017-11-18 DIAGNOSIS — M549 Dorsalgia, unspecified: Secondary | ICD-10-CM | POA: Diagnosis not present

## 2017-11-19 DIAGNOSIS — M549 Dorsalgia, unspecified: Secondary | ICD-10-CM | POA: Diagnosis not present

## 2017-11-20 DIAGNOSIS — M549 Dorsalgia, unspecified: Secondary | ICD-10-CM | POA: Diagnosis not present

## 2017-11-21 DIAGNOSIS — M549 Dorsalgia, unspecified: Secondary | ICD-10-CM | POA: Diagnosis not present

## 2017-11-22 DIAGNOSIS — M549 Dorsalgia, unspecified: Secondary | ICD-10-CM | POA: Diagnosis not present

## 2017-11-23 DIAGNOSIS — M549 Dorsalgia, unspecified: Secondary | ICD-10-CM | POA: Diagnosis not present

## 2017-11-24 DIAGNOSIS — M549 Dorsalgia, unspecified: Secondary | ICD-10-CM | POA: Diagnosis not present

## 2017-11-25 DIAGNOSIS — M5412 Radiculopathy, cervical region: Secondary | ICD-10-CM | POA: Diagnosis not present

## 2017-11-25 DIAGNOSIS — G939 Disorder of brain, unspecified: Secondary | ICD-10-CM | POA: Diagnosis not present

## 2017-11-25 DIAGNOSIS — Z79899 Other long term (current) drug therapy: Secondary | ICD-10-CM | POA: Diagnosis not present

## 2017-11-25 DIAGNOSIS — G319 Degenerative disease of nervous system, unspecified: Secondary | ICD-10-CM | POA: Diagnosis not present

## 2017-11-25 DIAGNOSIS — M5417 Radiculopathy, lumbosacral region: Secondary | ICD-10-CM | POA: Diagnosis not present

## 2017-11-25 DIAGNOSIS — M549 Dorsalgia, unspecified: Secondary | ICD-10-CM | POA: Diagnosis not present

## 2017-11-25 DIAGNOSIS — G35 Multiple sclerosis: Secondary | ICD-10-CM | POA: Diagnosis not present

## 2017-11-25 DIAGNOSIS — G603 Idiopathic progressive neuropathy: Secondary | ICD-10-CM | POA: Diagnosis not present

## 2017-11-25 IMAGING — CT CT HEART MORP W/ CTA COR W/ SCORE W/ CA W/CM &/OR W/O CM
1 of 4 series · 13 of 20 positions shown, 17 images · non-contrast
Comparison: None.

MEDICATIONS:
Lopressor:  200  mg, P.O.

Nitroglycerin 400 mcg, sublingual

CLINICAL DATA: 40-year-old male with history of chest pain.

[Series 6: ds_coradseq 0.5 bv36 3 70% · axial · 0.31mm/px · z∈[+1070,+1201]mm · 13 of 611 slices shown, 17 images]
[im 44/611  vessel]
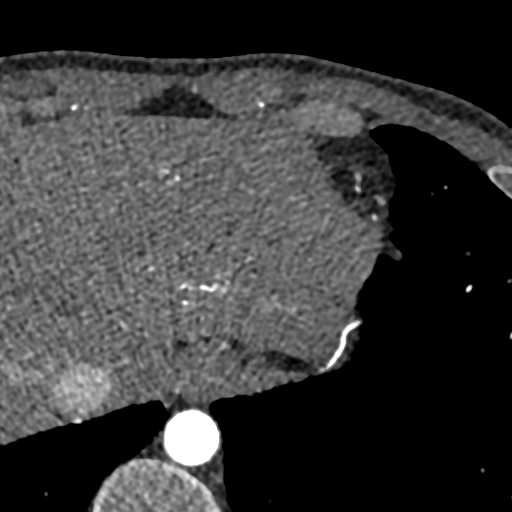
[im 44/611  lung]
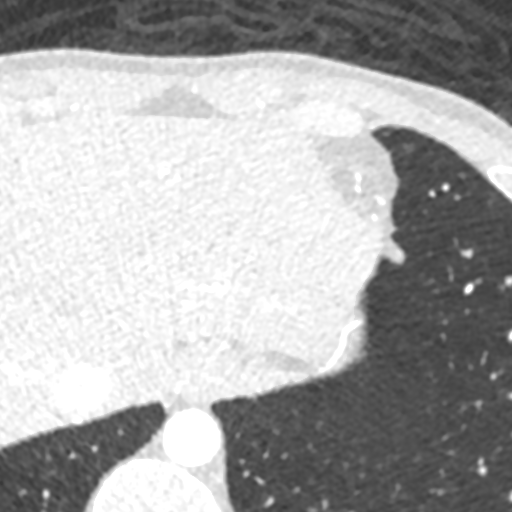
[im 88/611  vessel]
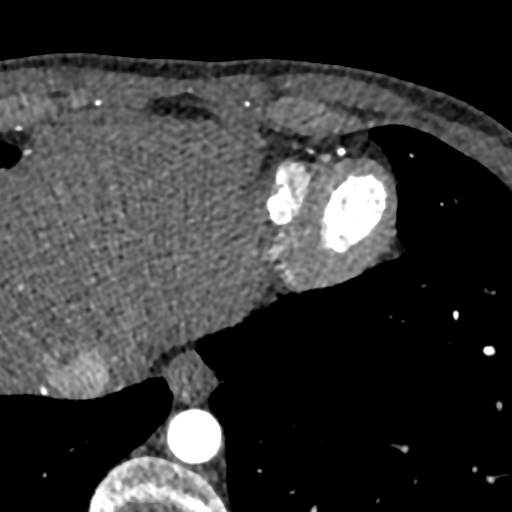
[im 131/611  vessel]
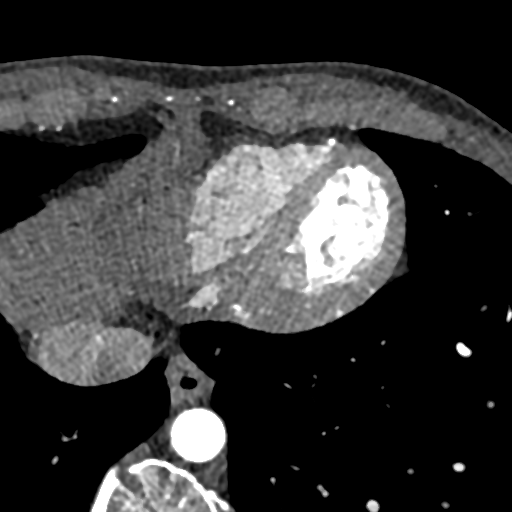
[im 175/611  vessel]
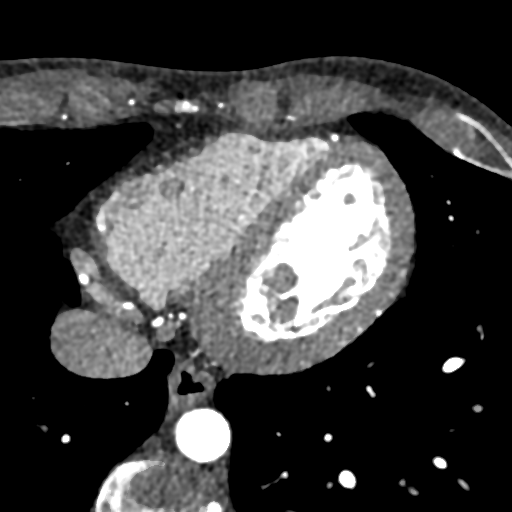
[im 218/611  vessel]
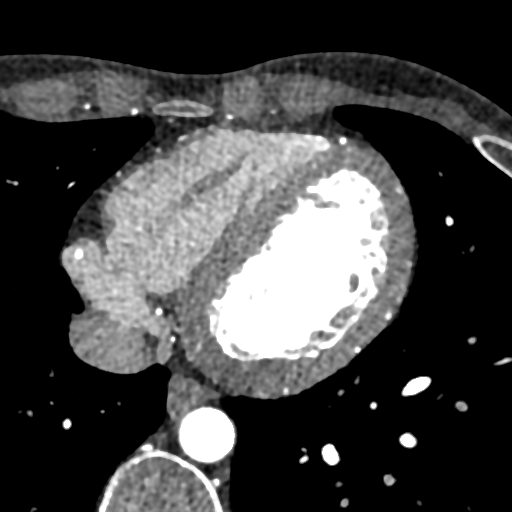
[im 218/611  lung]
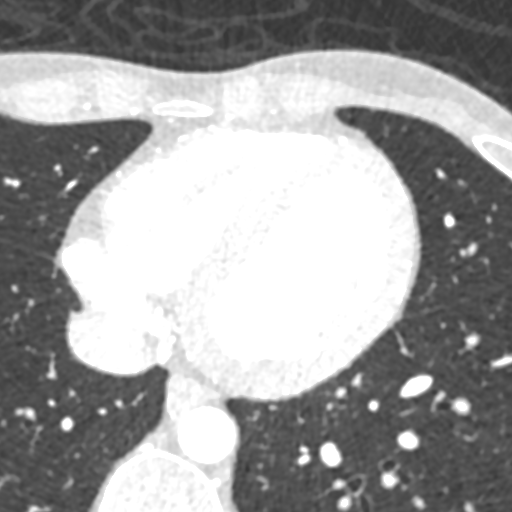
[im 262/611  vessel]
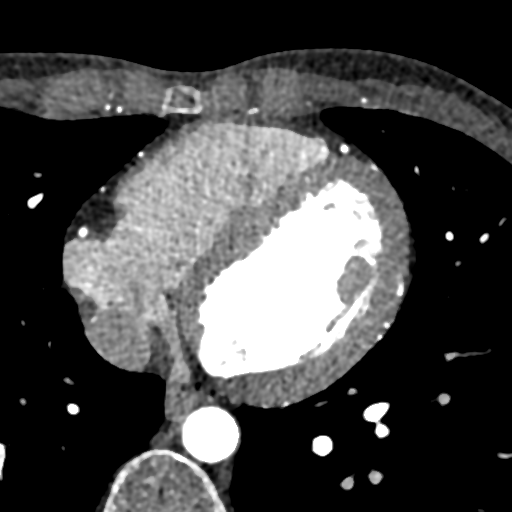
[im 306/611  vessel]
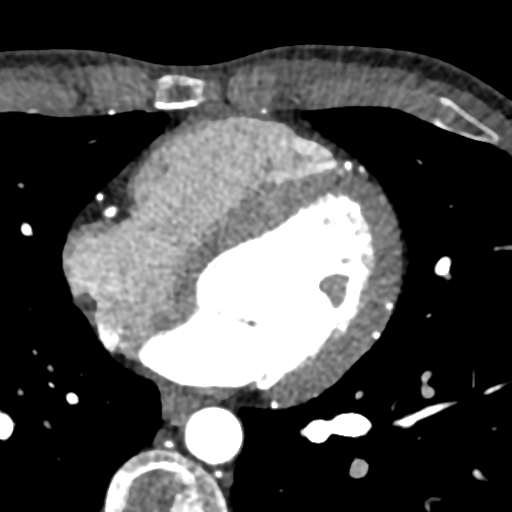
[im 349/611  vessel]
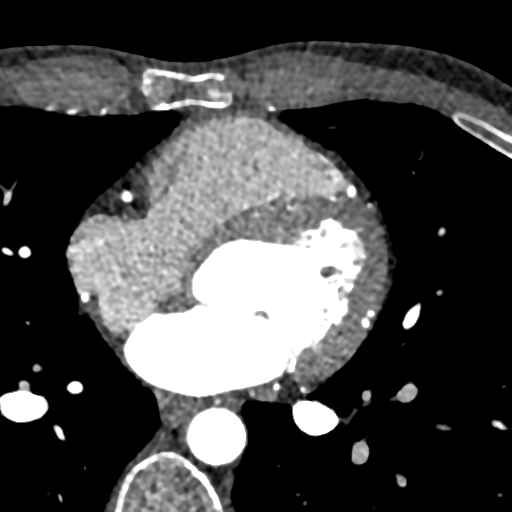
[im 393/611  vessel]
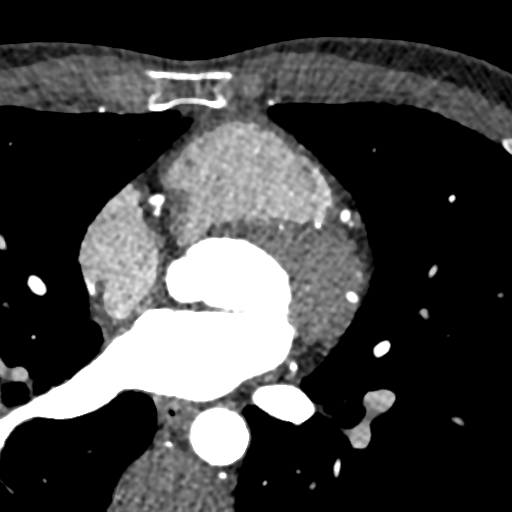
[im 393/611  lung]
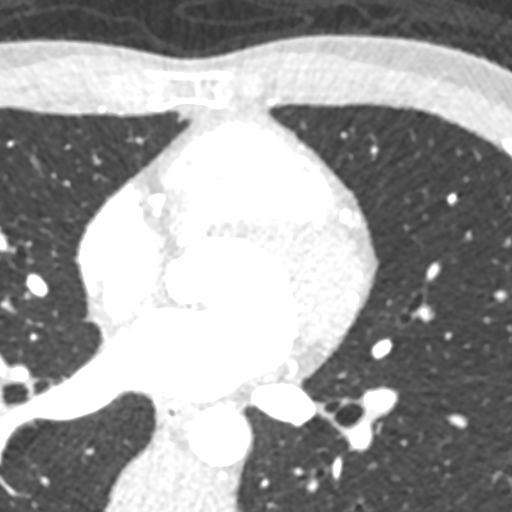
[im 436/611  vessel]
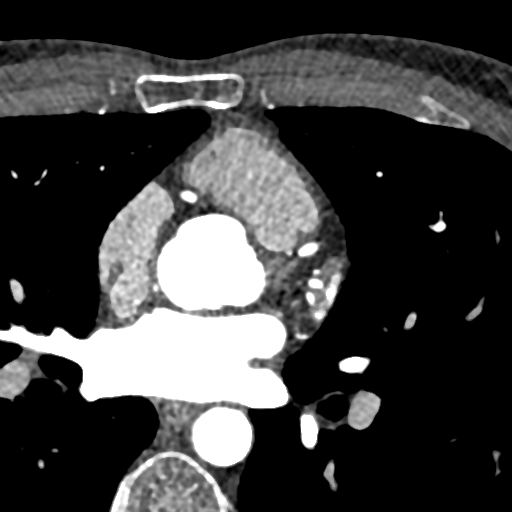
[im 480/611  vessel]
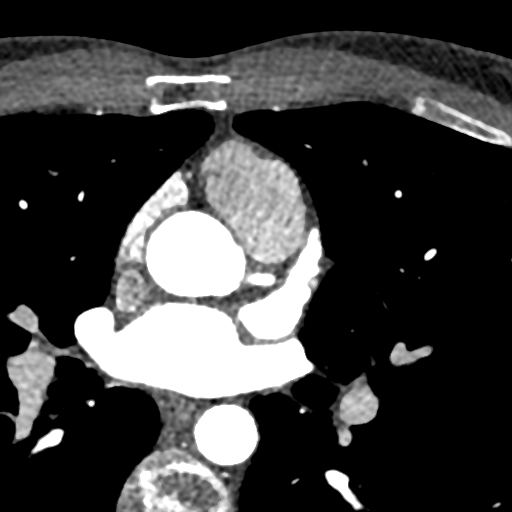
[im 523/611  vessel]
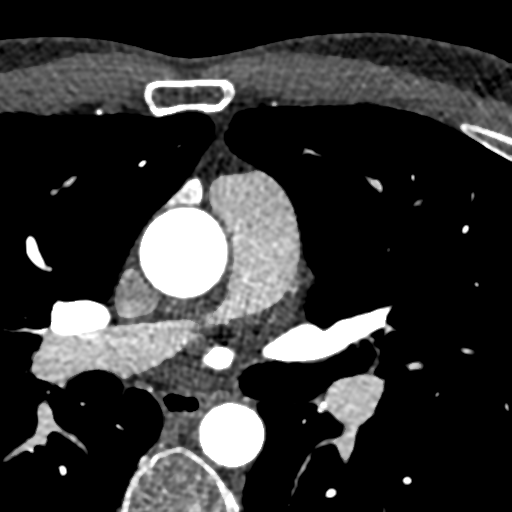
[im 567/611  vessel]
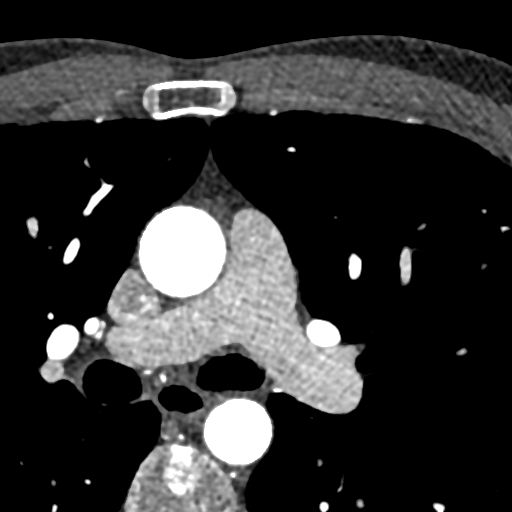
[im 567/611  lung]
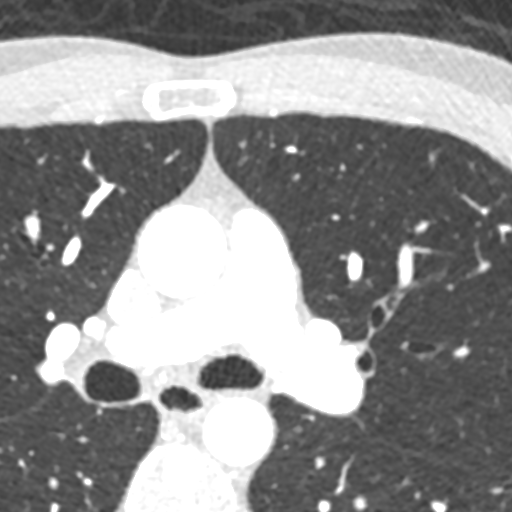

[13 of 20 positions shown; findings below may reference images not displayed]

EXAM:
CT ANGIOGRAPHY OF THE HEART, CORONARY ARTERY, STRUCTURE, AND
MORPHOLOGY

PROCEDURE:
CT angiography of the coronary vessels was performed on a 192
channel system using prospective ECG gating. A scout and ECG-gated
noncontrast exam (for calcium scoring) were performed. Appropriate
delay was determined by bolus tracking after injection of iodinated
contrast, and an ECG-gated coronary CTA was performed with sub-mm
slice collimation during late diastole. Imaging post processing was
performed on an independent workstation creating multiplanar and 3-D
images, allowing for quantitative analysis of the heart and coronary
arteries. Note that this exam targets the heart and the chest was
not imaged in its entirety.
FINDINGS: Technical quality:  Excellent

Heart rate:  58 bpm

CORONARY ARTERIES:

Left Main:  Negative (CAD-RADS 0).

LAD: Minimal nonobstructive calcified plaque in the proximal left
anterior descending coronary artery with no significant stenosis
(CAD-RADS 1).

Diagonals:  (CAD-RADS 0).

LCx:  (CAD-RADS 0).

OMs:  (CAD-RADS 0).

RCA: (CAD-RADS 0). A short portion of the right coronary artery is
intracavitary within the inferior aspect of the right atrium (normal
anatomical variant).

PDA:  (CAD-RADS 0).

Dominance:  Right.

CORONARY CALCIUM: Total Agatston Score:  1

[HOSPITAL] percentile: N/A (this is outside the [HOSPITAL]
range, but would be 81st percentile for a 45 year old male)

AORTA AND PULMONARY MEASUREMENTS:

Aortic root (21 - 40 mm):

25  mm at the annulus

34  mm at the sinuses of Valsalva

26  mm at the sinotubular junction

Ascending aorta: (< 40 mm):  28 mm

Descending aorta:  (< 40 mm):  21 mm

Main pulmonary artery: (< 30 mm):  22 mm

OTHER FINDINGS: Within the visualized portions of the thorax there
are no suspicious appearing pulmonary nodules or masses, there is no
acute consolidative airspace disease, no pleural effusions, no
pneumothorax and no lymphadenopathy. Visualized portions of the
upper abdomen are unremarkable. There are no aggressive appearing
lytic or blastic lesions noted in the visualized portions of the
skeleton.
IMPRESSION: 1. Minimal nonobstructive coronary artery atherosclerosis with tiny
calcified plaque in the proximal left anterior descending coronary
artery associated with no significant stenosis. Patient's total
coronary artery calcium score is 1. The [HOSPITAL] does not
include individuals less than 45 years of age. For a 45-year-old
African American male, a calcium score of 1 would be 81st
percentile. Assessment for potential risk factor modification,
dietary therapy or pharmacologic therapy may be warranted, if
clinically indicated.
2. No significant incidental noncardiac findings.

## 2017-11-26 DIAGNOSIS — M549 Dorsalgia, unspecified: Secondary | ICD-10-CM | POA: Diagnosis not present

## 2017-11-27 DIAGNOSIS — M549 Dorsalgia, unspecified: Secondary | ICD-10-CM | POA: Diagnosis not present

## 2017-11-28 DIAGNOSIS — M549 Dorsalgia, unspecified: Secondary | ICD-10-CM | POA: Diagnosis not present

## 2017-11-29 DIAGNOSIS — M549 Dorsalgia, unspecified: Secondary | ICD-10-CM | POA: Diagnosis not present

## 2017-11-30 DIAGNOSIS — M549 Dorsalgia, unspecified: Secondary | ICD-10-CM | POA: Diagnosis not present

## 2017-12-01 DIAGNOSIS — M549 Dorsalgia, unspecified: Secondary | ICD-10-CM | POA: Diagnosis not present

## 2017-12-02 DIAGNOSIS — M549 Dorsalgia, unspecified: Secondary | ICD-10-CM | POA: Diagnosis not present

## 2017-12-03 DIAGNOSIS — M549 Dorsalgia, unspecified: Secondary | ICD-10-CM | POA: Diagnosis not present

## 2017-12-04 DIAGNOSIS — M549 Dorsalgia, unspecified: Secondary | ICD-10-CM | POA: Diagnosis not present

## 2017-12-05 DIAGNOSIS — M549 Dorsalgia, unspecified: Secondary | ICD-10-CM | POA: Diagnosis not present

## 2017-12-06 DIAGNOSIS — M549 Dorsalgia, unspecified: Secondary | ICD-10-CM | POA: Diagnosis not present

## 2017-12-07 DIAGNOSIS — M549 Dorsalgia, unspecified: Secondary | ICD-10-CM | POA: Diagnosis not present

## 2017-12-08 DIAGNOSIS — M549 Dorsalgia, unspecified: Secondary | ICD-10-CM | POA: Diagnosis not present

## 2017-12-09 DIAGNOSIS — M549 Dorsalgia, unspecified: Secondary | ICD-10-CM | POA: Diagnosis not present

## 2017-12-10 DIAGNOSIS — M549 Dorsalgia, unspecified: Secondary | ICD-10-CM | POA: Diagnosis not present

## 2017-12-11 DIAGNOSIS — M549 Dorsalgia, unspecified: Secondary | ICD-10-CM | POA: Diagnosis not present

## 2017-12-12 DIAGNOSIS — M549 Dorsalgia, unspecified: Secondary | ICD-10-CM | POA: Diagnosis not present

## 2017-12-13 DIAGNOSIS — M549 Dorsalgia, unspecified: Secondary | ICD-10-CM | POA: Diagnosis not present

## 2017-12-14 DIAGNOSIS — M549 Dorsalgia, unspecified: Secondary | ICD-10-CM | POA: Diagnosis not present

## 2017-12-15 DIAGNOSIS — M549 Dorsalgia, unspecified: Secondary | ICD-10-CM | POA: Diagnosis not present

## 2017-12-16 DIAGNOSIS — M549 Dorsalgia, unspecified: Secondary | ICD-10-CM | POA: Diagnosis not present

## 2017-12-17 DIAGNOSIS — M549 Dorsalgia, unspecified: Secondary | ICD-10-CM | POA: Diagnosis not present

## 2017-12-18 DIAGNOSIS — M549 Dorsalgia, unspecified: Secondary | ICD-10-CM | POA: Diagnosis not present

## 2017-12-19 DIAGNOSIS — M549 Dorsalgia, unspecified: Secondary | ICD-10-CM | POA: Diagnosis not present

## 2017-12-20 DIAGNOSIS — M549 Dorsalgia, unspecified: Secondary | ICD-10-CM | POA: Diagnosis not present

## 2017-12-21 DIAGNOSIS — M549 Dorsalgia, unspecified: Secondary | ICD-10-CM | POA: Diagnosis not present

## 2017-12-22 DIAGNOSIS — M549 Dorsalgia, unspecified: Secondary | ICD-10-CM | POA: Diagnosis not present

## 2017-12-23 DIAGNOSIS — M549 Dorsalgia, unspecified: Secondary | ICD-10-CM | POA: Diagnosis not present

## 2017-12-24 DIAGNOSIS — M549 Dorsalgia, unspecified: Secondary | ICD-10-CM | POA: Diagnosis not present

## 2017-12-25 DIAGNOSIS — M549 Dorsalgia, unspecified: Secondary | ICD-10-CM | POA: Diagnosis not present

## 2017-12-26 DIAGNOSIS — M549 Dorsalgia, unspecified: Secondary | ICD-10-CM | POA: Diagnosis not present

## 2017-12-27 DIAGNOSIS — M549 Dorsalgia, unspecified: Secondary | ICD-10-CM | POA: Diagnosis not present

## 2017-12-28 DIAGNOSIS — M549 Dorsalgia, unspecified: Secondary | ICD-10-CM | POA: Diagnosis not present

## 2017-12-29 DIAGNOSIS — M549 Dorsalgia, unspecified: Secondary | ICD-10-CM | POA: Diagnosis not present

## 2017-12-30 DIAGNOSIS — M549 Dorsalgia, unspecified: Secondary | ICD-10-CM | POA: Diagnosis not present

## 2017-12-31 DIAGNOSIS — M549 Dorsalgia, unspecified: Secondary | ICD-10-CM | POA: Diagnosis not present

## 2018-01-01 DIAGNOSIS — M549 Dorsalgia, unspecified: Secondary | ICD-10-CM | POA: Diagnosis not present

## 2018-01-02 DIAGNOSIS — M549 Dorsalgia, unspecified: Secondary | ICD-10-CM | POA: Diagnosis not present

## 2018-01-03 DIAGNOSIS — M549 Dorsalgia, unspecified: Secondary | ICD-10-CM | POA: Diagnosis not present

## 2018-01-04 DIAGNOSIS — M549 Dorsalgia, unspecified: Secondary | ICD-10-CM | POA: Diagnosis not present

## 2018-01-05 DIAGNOSIS — M549 Dorsalgia, unspecified: Secondary | ICD-10-CM | POA: Diagnosis not present

## 2018-01-19 DIAGNOSIS — G35 Multiple sclerosis: Secondary | ICD-10-CM | POA: Diagnosis not present

## 2018-01-26 DIAGNOSIS — M549 Dorsalgia, unspecified: Secondary | ICD-10-CM | POA: Diagnosis not present

## 2018-01-27 DIAGNOSIS — M549 Dorsalgia, unspecified: Secondary | ICD-10-CM | POA: Diagnosis not present

## 2018-01-28 DIAGNOSIS — M549 Dorsalgia, unspecified: Secondary | ICD-10-CM | POA: Diagnosis not present

## 2018-01-29 DIAGNOSIS — M549 Dorsalgia, unspecified: Secondary | ICD-10-CM | POA: Diagnosis not present

## 2018-01-30 DIAGNOSIS — M549 Dorsalgia, unspecified: Secondary | ICD-10-CM | POA: Diagnosis not present

## 2018-01-31 DIAGNOSIS — M549 Dorsalgia, unspecified: Secondary | ICD-10-CM | POA: Diagnosis not present

## 2018-02-01 DIAGNOSIS — M549 Dorsalgia, unspecified: Secondary | ICD-10-CM | POA: Diagnosis not present

## 2018-02-02 DIAGNOSIS — M549 Dorsalgia, unspecified: Secondary | ICD-10-CM | POA: Diagnosis not present

## 2018-02-03 DIAGNOSIS — M549 Dorsalgia, unspecified: Secondary | ICD-10-CM | POA: Diagnosis not present

## 2018-02-04 DIAGNOSIS — M549 Dorsalgia, unspecified: Secondary | ICD-10-CM | POA: Diagnosis not present

## 2018-02-05 DIAGNOSIS — M549 Dorsalgia, unspecified: Secondary | ICD-10-CM | POA: Diagnosis not present

## 2018-02-06 DIAGNOSIS — M549 Dorsalgia, unspecified: Secondary | ICD-10-CM | POA: Diagnosis not present

## 2018-02-07 DIAGNOSIS — M549 Dorsalgia, unspecified: Secondary | ICD-10-CM | POA: Diagnosis not present

## 2018-02-08 DIAGNOSIS — M549 Dorsalgia, unspecified: Secondary | ICD-10-CM | POA: Diagnosis not present

## 2018-02-09 DIAGNOSIS — M549 Dorsalgia, unspecified: Secondary | ICD-10-CM | POA: Diagnosis not present

## 2018-02-10 DIAGNOSIS — M549 Dorsalgia, unspecified: Secondary | ICD-10-CM | POA: Diagnosis not present

## 2018-02-11 DIAGNOSIS — M549 Dorsalgia, unspecified: Secondary | ICD-10-CM | POA: Diagnosis not present

## 2018-02-12 DIAGNOSIS — M549 Dorsalgia, unspecified: Secondary | ICD-10-CM | POA: Diagnosis not present

## 2018-02-13 DIAGNOSIS — M549 Dorsalgia, unspecified: Secondary | ICD-10-CM | POA: Diagnosis not present

## 2018-02-14 DIAGNOSIS — M549 Dorsalgia, unspecified: Secondary | ICD-10-CM | POA: Diagnosis not present

## 2018-02-15 DIAGNOSIS — M549 Dorsalgia, unspecified: Secondary | ICD-10-CM | POA: Diagnosis not present

## 2018-02-16 DIAGNOSIS — M549 Dorsalgia, unspecified: Secondary | ICD-10-CM | POA: Diagnosis not present

## 2018-02-17 DIAGNOSIS — M549 Dorsalgia, unspecified: Secondary | ICD-10-CM | POA: Diagnosis not present

## 2018-02-18 DIAGNOSIS — M549 Dorsalgia, unspecified: Secondary | ICD-10-CM | POA: Diagnosis not present

## 2018-02-19 DIAGNOSIS — M549 Dorsalgia, unspecified: Secondary | ICD-10-CM | POA: Diagnosis not present

## 2018-02-20 DIAGNOSIS — M549 Dorsalgia, unspecified: Secondary | ICD-10-CM | POA: Diagnosis not present

## 2018-02-21 DIAGNOSIS — M549 Dorsalgia, unspecified: Secondary | ICD-10-CM | POA: Diagnosis not present

## 2018-02-22 DIAGNOSIS — M549 Dorsalgia, unspecified: Secondary | ICD-10-CM | POA: Diagnosis not present

## 2018-02-23 DIAGNOSIS — M549 Dorsalgia, unspecified: Secondary | ICD-10-CM | POA: Diagnosis not present

## 2018-02-24 DIAGNOSIS — M549 Dorsalgia, unspecified: Secondary | ICD-10-CM | POA: Diagnosis not present

## 2018-02-25 DIAGNOSIS — M549 Dorsalgia, unspecified: Secondary | ICD-10-CM | POA: Diagnosis not present

## 2018-02-26 DIAGNOSIS — M549 Dorsalgia, unspecified: Secondary | ICD-10-CM | POA: Diagnosis not present

## 2018-02-27 DIAGNOSIS — M549 Dorsalgia, unspecified: Secondary | ICD-10-CM | POA: Diagnosis not present

## 2018-02-28 DIAGNOSIS — M549 Dorsalgia, unspecified: Secondary | ICD-10-CM | POA: Diagnosis not present

## 2018-03-01 DIAGNOSIS — M549 Dorsalgia, unspecified: Secondary | ICD-10-CM | POA: Diagnosis not present

## 2018-03-02 DIAGNOSIS — M549 Dorsalgia, unspecified: Secondary | ICD-10-CM | POA: Diagnosis not present

## 2018-03-03 DIAGNOSIS — M549 Dorsalgia, unspecified: Secondary | ICD-10-CM | POA: Diagnosis not present

## 2018-03-04 DIAGNOSIS — M549 Dorsalgia, unspecified: Secondary | ICD-10-CM | POA: Diagnosis not present

## 2018-03-04 DIAGNOSIS — G5603 Carpal tunnel syndrome, bilateral upper limbs: Secondary | ICD-10-CM | POA: Diagnosis not present

## 2018-03-04 DIAGNOSIS — R252 Cramp and spasm: Secondary | ICD-10-CM | POA: Diagnosis not present

## 2018-03-04 DIAGNOSIS — M5412 Radiculopathy, cervical region: Secondary | ICD-10-CM | POA: Diagnosis not present

## 2018-03-04 DIAGNOSIS — M5417 Radiculopathy, lumbosacral region: Secondary | ICD-10-CM | POA: Diagnosis not present

## 2018-03-04 DIAGNOSIS — Z79899 Other long term (current) drug therapy: Secondary | ICD-10-CM | POA: Diagnosis not present

## 2018-03-05 DIAGNOSIS — M549 Dorsalgia, unspecified: Secondary | ICD-10-CM | POA: Diagnosis not present

## 2018-03-06 DIAGNOSIS — M549 Dorsalgia, unspecified: Secondary | ICD-10-CM | POA: Diagnosis not present

## 2018-03-07 DIAGNOSIS — M549 Dorsalgia, unspecified: Secondary | ICD-10-CM | POA: Diagnosis not present

## 2018-03-08 DIAGNOSIS — M549 Dorsalgia, unspecified: Secondary | ICD-10-CM | POA: Diagnosis not present

## 2018-03-09 DIAGNOSIS — M549 Dorsalgia, unspecified: Secondary | ICD-10-CM | POA: Diagnosis not present

## 2018-03-10 DIAGNOSIS — M549 Dorsalgia, unspecified: Secondary | ICD-10-CM | POA: Diagnosis not present

## 2018-03-11 DIAGNOSIS — M549 Dorsalgia, unspecified: Secondary | ICD-10-CM | POA: Diagnosis not present

## 2018-03-12 DIAGNOSIS — M549 Dorsalgia, unspecified: Secondary | ICD-10-CM | POA: Diagnosis not present

## 2018-03-13 DIAGNOSIS — M549 Dorsalgia, unspecified: Secondary | ICD-10-CM | POA: Diagnosis not present

## 2018-03-14 DIAGNOSIS — M549 Dorsalgia, unspecified: Secondary | ICD-10-CM | POA: Diagnosis not present

## 2018-03-15 DIAGNOSIS — M549 Dorsalgia, unspecified: Secondary | ICD-10-CM | POA: Diagnosis not present

## 2018-03-16 DIAGNOSIS — M549 Dorsalgia, unspecified: Secondary | ICD-10-CM | POA: Diagnosis not present

## 2018-03-17 DIAGNOSIS — M549 Dorsalgia, unspecified: Secondary | ICD-10-CM | POA: Diagnosis not present

## 2018-03-18 DIAGNOSIS — M549 Dorsalgia, unspecified: Secondary | ICD-10-CM | POA: Diagnosis not present

## 2018-03-19 DIAGNOSIS — M549 Dorsalgia, unspecified: Secondary | ICD-10-CM | POA: Diagnosis not present

## 2018-03-20 DIAGNOSIS — M549 Dorsalgia, unspecified: Secondary | ICD-10-CM | POA: Diagnosis not present

## 2018-03-21 DIAGNOSIS — M549 Dorsalgia, unspecified: Secondary | ICD-10-CM | POA: Diagnosis not present

## 2018-03-22 DIAGNOSIS — M549 Dorsalgia, unspecified: Secondary | ICD-10-CM | POA: Diagnosis not present

## 2018-03-23 DIAGNOSIS — M549 Dorsalgia, unspecified: Secondary | ICD-10-CM | POA: Diagnosis not present

## 2018-03-24 DIAGNOSIS — M549 Dorsalgia, unspecified: Secondary | ICD-10-CM | POA: Diagnosis not present

## 2018-03-25 DIAGNOSIS — M549 Dorsalgia, unspecified: Secondary | ICD-10-CM | POA: Diagnosis not present

## 2018-03-26 DIAGNOSIS — M549 Dorsalgia, unspecified: Secondary | ICD-10-CM | POA: Diagnosis not present

## 2018-03-27 DIAGNOSIS — M549 Dorsalgia, unspecified: Secondary | ICD-10-CM | POA: Diagnosis not present

## 2018-03-28 DIAGNOSIS — M549 Dorsalgia, unspecified: Secondary | ICD-10-CM | POA: Diagnosis not present

## 2018-03-29 DIAGNOSIS — M549 Dorsalgia, unspecified: Secondary | ICD-10-CM | POA: Diagnosis not present

## 2018-03-30 DIAGNOSIS — M549 Dorsalgia, unspecified: Secondary | ICD-10-CM | POA: Diagnosis not present

## 2018-03-31 DIAGNOSIS — M549 Dorsalgia, unspecified: Secondary | ICD-10-CM | POA: Diagnosis not present

## 2018-04-01 DIAGNOSIS — M549 Dorsalgia, unspecified: Secondary | ICD-10-CM | POA: Diagnosis not present

## 2018-04-02 DIAGNOSIS — M549 Dorsalgia, unspecified: Secondary | ICD-10-CM | POA: Diagnosis not present

## 2018-04-03 DIAGNOSIS — M549 Dorsalgia, unspecified: Secondary | ICD-10-CM | POA: Diagnosis not present

## 2018-04-04 DIAGNOSIS — M549 Dorsalgia, unspecified: Secondary | ICD-10-CM | POA: Diagnosis not present

## 2018-04-05 DIAGNOSIS — M549 Dorsalgia, unspecified: Secondary | ICD-10-CM | POA: Diagnosis not present

## 2018-04-06 DIAGNOSIS — M549 Dorsalgia, unspecified: Secondary | ICD-10-CM | POA: Diagnosis not present

## 2018-04-07 DIAGNOSIS — M549 Dorsalgia, unspecified: Secondary | ICD-10-CM | POA: Diagnosis not present

## 2018-04-08 DIAGNOSIS — M549 Dorsalgia, unspecified: Secondary | ICD-10-CM | POA: Diagnosis not present

## 2018-04-09 DIAGNOSIS — M549 Dorsalgia, unspecified: Secondary | ICD-10-CM | POA: Diagnosis not present

## 2018-04-10 DIAGNOSIS — M549 Dorsalgia, unspecified: Secondary | ICD-10-CM | POA: Diagnosis not present

## 2018-04-11 DIAGNOSIS — M549 Dorsalgia, unspecified: Secondary | ICD-10-CM | POA: Diagnosis not present

## 2018-04-12 DIAGNOSIS — G35 Multiple sclerosis: Secondary | ICD-10-CM | POA: Diagnosis not present

## 2018-04-12 DIAGNOSIS — N528 Other male erectile dysfunction: Secondary | ICD-10-CM | POA: Diagnosis not present

## 2018-04-12 DIAGNOSIS — J310 Chronic rhinitis: Secondary | ICD-10-CM | POA: Diagnosis not present

## 2018-04-12 DIAGNOSIS — R82998 Other abnormal findings in urine: Secondary | ICD-10-CM | POA: Diagnosis not present

## 2018-04-12 DIAGNOSIS — M549 Dorsalgia, unspecified: Secondary | ICD-10-CM | POA: Diagnosis not present

## 2018-04-12 DIAGNOSIS — G4733 Obstructive sleep apnea (adult) (pediatric): Secondary | ICD-10-CM | POA: Diagnosis not present

## 2018-04-12 DIAGNOSIS — E559 Vitamin D deficiency, unspecified: Secondary | ICD-10-CM | POA: Diagnosis not present

## 2018-04-12 DIAGNOSIS — E7849 Other hyperlipidemia: Secondary | ICD-10-CM | POA: Diagnosis not present

## 2018-04-12 DIAGNOSIS — R0789 Other chest pain: Secondary | ICD-10-CM | POA: Diagnosis not present

## 2018-04-12 DIAGNOSIS — M5489 Other dorsalgia: Secondary | ICD-10-CM | POA: Diagnosis not present

## 2018-04-12 DIAGNOSIS — I10 Essential (primary) hypertension: Secondary | ICD-10-CM | POA: Diagnosis not present

## 2018-04-12 DIAGNOSIS — Z125 Encounter for screening for malignant neoplasm of prostate: Secondary | ICD-10-CM | POA: Diagnosis not present

## 2018-04-12 DIAGNOSIS — Z0000 Encounter for general adult medical examination without abnormal findings: Secondary | ICD-10-CM | POA: Diagnosis not present

## 2018-04-13 DIAGNOSIS — M549 Dorsalgia, unspecified: Secondary | ICD-10-CM | POA: Diagnosis not present

## 2018-04-14 DIAGNOSIS — M549 Dorsalgia, unspecified: Secondary | ICD-10-CM | POA: Diagnosis not present

## 2018-04-15 DIAGNOSIS — M549 Dorsalgia, unspecified: Secondary | ICD-10-CM | POA: Diagnosis not present

## 2018-04-16 DIAGNOSIS — M549 Dorsalgia, unspecified: Secondary | ICD-10-CM | POA: Diagnosis not present

## 2018-04-17 DIAGNOSIS — M549 Dorsalgia, unspecified: Secondary | ICD-10-CM | POA: Diagnosis not present

## 2018-04-18 DIAGNOSIS — M549 Dorsalgia, unspecified: Secondary | ICD-10-CM | POA: Diagnosis not present

## 2018-04-19 DIAGNOSIS — M549 Dorsalgia, unspecified: Secondary | ICD-10-CM | POA: Diagnosis not present

## 2018-04-20 DIAGNOSIS — M549 Dorsalgia, unspecified: Secondary | ICD-10-CM | POA: Diagnosis not present

## 2018-04-21 DIAGNOSIS — M549 Dorsalgia, unspecified: Secondary | ICD-10-CM | POA: Diagnosis not present

## 2018-04-22 DIAGNOSIS — M549 Dorsalgia, unspecified: Secondary | ICD-10-CM | POA: Diagnosis not present

## 2018-04-23 DIAGNOSIS — M549 Dorsalgia, unspecified: Secondary | ICD-10-CM | POA: Diagnosis not present

## 2018-04-24 DIAGNOSIS — M549 Dorsalgia, unspecified: Secondary | ICD-10-CM | POA: Diagnosis not present

## 2018-04-25 DIAGNOSIS — M549 Dorsalgia, unspecified: Secondary | ICD-10-CM | POA: Diagnosis not present

## 2018-04-26 DIAGNOSIS — M549 Dorsalgia, unspecified: Secondary | ICD-10-CM | POA: Diagnosis not present

## 2018-04-27 DIAGNOSIS — M549 Dorsalgia, unspecified: Secondary | ICD-10-CM | POA: Diagnosis not present

## 2018-04-28 DIAGNOSIS — M549 Dorsalgia, unspecified: Secondary | ICD-10-CM | POA: Diagnosis not present

## 2018-04-29 DIAGNOSIS — M549 Dorsalgia, unspecified: Secondary | ICD-10-CM | POA: Diagnosis not present

## 2018-04-30 DIAGNOSIS — M549 Dorsalgia, unspecified: Secondary | ICD-10-CM | POA: Diagnosis not present

## 2018-05-01 DIAGNOSIS — M549 Dorsalgia, unspecified: Secondary | ICD-10-CM | POA: Diagnosis not present

## 2018-05-02 DIAGNOSIS — M549 Dorsalgia, unspecified: Secondary | ICD-10-CM | POA: Diagnosis not present

## 2018-05-03 DIAGNOSIS — M549 Dorsalgia, unspecified: Secondary | ICD-10-CM | POA: Diagnosis not present

## 2018-05-04 DIAGNOSIS — M549 Dorsalgia, unspecified: Secondary | ICD-10-CM | POA: Diagnosis not present

## 2018-05-05 DIAGNOSIS — M549 Dorsalgia, unspecified: Secondary | ICD-10-CM | POA: Diagnosis not present

## 2018-05-06 DIAGNOSIS — M549 Dorsalgia, unspecified: Secondary | ICD-10-CM | POA: Diagnosis not present

## 2018-05-07 DIAGNOSIS — M549 Dorsalgia, unspecified: Secondary | ICD-10-CM | POA: Diagnosis not present

## 2018-05-08 DIAGNOSIS — M549 Dorsalgia, unspecified: Secondary | ICD-10-CM | POA: Diagnosis not present

## 2018-05-09 DIAGNOSIS — M549 Dorsalgia, unspecified: Secondary | ICD-10-CM | POA: Diagnosis not present

## 2018-05-10 DIAGNOSIS — M549 Dorsalgia, unspecified: Secondary | ICD-10-CM | POA: Diagnosis not present

## 2018-05-11 DIAGNOSIS — M549 Dorsalgia, unspecified: Secondary | ICD-10-CM | POA: Diagnosis not present

## 2018-05-12 DIAGNOSIS — M549 Dorsalgia, unspecified: Secondary | ICD-10-CM | POA: Diagnosis not present

## 2018-05-12 DIAGNOSIS — M79671 Pain in right foot: Secondary | ICD-10-CM | POA: Diagnosis not present

## 2018-05-13 DIAGNOSIS — M549 Dorsalgia, unspecified: Secondary | ICD-10-CM | POA: Diagnosis not present

## 2018-05-14 DIAGNOSIS — M549 Dorsalgia, unspecified: Secondary | ICD-10-CM | POA: Diagnosis not present

## 2018-05-15 DIAGNOSIS — M549 Dorsalgia, unspecified: Secondary | ICD-10-CM | POA: Diagnosis not present

## 2018-05-16 DIAGNOSIS — M549 Dorsalgia, unspecified: Secondary | ICD-10-CM | POA: Diagnosis not present

## 2018-05-24 DIAGNOSIS — M549 Dorsalgia, unspecified: Secondary | ICD-10-CM | POA: Diagnosis not present

## 2018-05-25 DIAGNOSIS — M549 Dorsalgia, unspecified: Secondary | ICD-10-CM | POA: Diagnosis not present

## 2018-05-26 DIAGNOSIS — M549 Dorsalgia, unspecified: Secondary | ICD-10-CM | POA: Diagnosis not present

## 2018-05-27 DIAGNOSIS — M549 Dorsalgia, unspecified: Secondary | ICD-10-CM | POA: Diagnosis not present

## 2018-05-28 DIAGNOSIS — M549 Dorsalgia, unspecified: Secondary | ICD-10-CM | POA: Diagnosis not present

## 2018-05-29 DIAGNOSIS — M549 Dorsalgia, unspecified: Secondary | ICD-10-CM | POA: Diagnosis not present

## 2018-05-30 DIAGNOSIS — M549 Dorsalgia, unspecified: Secondary | ICD-10-CM | POA: Diagnosis not present

## 2018-05-31 DIAGNOSIS — M549 Dorsalgia, unspecified: Secondary | ICD-10-CM | POA: Diagnosis not present

## 2018-06-01 DIAGNOSIS — M549 Dorsalgia, unspecified: Secondary | ICD-10-CM | POA: Diagnosis not present

## 2018-06-02 DIAGNOSIS — M549 Dorsalgia, unspecified: Secondary | ICD-10-CM | POA: Diagnosis not present

## 2018-06-03 DIAGNOSIS — M549 Dorsalgia, unspecified: Secondary | ICD-10-CM | POA: Diagnosis not present

## 2018-06-04 DIAGNOSIS — M549 Dorsalgia, unspecified: Secondary | ICD-10-CM | POA: Diagnosis not present

## 2018-06-05 DIAGNOSIS — M549 Dorsalgia, unspecified: Secondary | ICD-10-CM | POA: Diagnosis not present

## 2018-06-06 DIAGNOSIS — M549 Dorsalgia, unspecified: Secondary | ICD-10-CM | POA: Diagnosis not present

## 2018-06-07 DIAGNOSIS — M549 Dorsalgia, unspecified: Secondary | ICD-10-CM | POA: Diagnosis not present

## 2018-06-08 DIAGNOSIS — M549 Dorsalgia, unspecified: Secondary | ICD-10-CM | POA: Diagnosis not present

## 2018-06-09 DIAGNOSIS — M549 Dorsalgia, unspecified: Secondary | ICD-10-CM | POA: Diagnosis not present

## 2018-06-10 DIAGNOSIS — M549 Dorsalgia, unspecified: Secondary | ICD-10-CM | POA: Diagnosis not present

## 2018-06-11 DIAGNOSIS — M549 Dorsalgia, unspecified: Secondary | ICD-10-CM | POA: Diagnosis not present

## 2018-06-12 DIAGNOSIS — M549 Dorsalgia, unspecified: Secondary | ICD-10-CM | POA: Diagnosis not present

## 2018-06-13 DIAGNOSIS — M549 Dorsalgia, unspecified: Secondary | ICD-10-CM | POA: Diagnosis not present

## 2018-06-14 DIAGNOSIS — M549 Dorsalgia, unspecified: Secondary | ICD-10-CM | POA: Diagnosis not present

## 2018-06-15 DIAGNOSIS — M549 Dorsalgia, unspecified: Secondary | ICD-10-CM | POA: Diagnosis not present

## 2018-06-16 DIAGNOSIS — M549 Dorsalgia, unspecified: Secondary | ICD-10-CM | POA: Diagnosis not present

## 2018-06-17 DIAGNOSIS — M549 Dorsalgia, unspecified: Secondary | ICD-10-CM | POA: Diagnosis not present

## 2018-06-18 DIAGNOSIS — M549 Dorsalgia, unspecified: Secondary | ICD-10-CM | POA: Diagnosis not present

## 2018-06-19 DIAGNOSIS — M549 Dorsalgia, unspecified: Secondary | ICD-10-CM | POA: Diagnosis not present

## 2018-06-20 DIAGNOSIS — M549 Dorsalgia, unspecified: Secondary | ICD-10-CM | POA: Diagnosis not present

## 2018-06-21 DIAGNOSIS — M549 Dorsalgia, unspecified: Secondary | ICD-10-CM | POA: Diagnosis not present

## 2018-06-22 DIAGNOSIS — M549 Dorsalgia, unspecified: Secondary | ICD-10-CM | POA: Diagnosis not present

## 2018-06-23 DIAGNOSIS — R252 Cramp and spasm: Secondary | ICD-10-CM | POA: Diagnosis not present

## 2018-06-23 DIAGNOSIS — M5441 Lumbago with sciatica, right side: Secondary | ICD-10-CM | POA: Diagnosis not present

## 2018-06-23 DIAGNOSIS — Z79899 Other long term (current) drug therapy: Secondary | ICD-10-CM | POA: Diagnosis not present

## 2018-06-23 DIAGNOSIS — G5603 Carpal tunnel syndrome, bilateral upper limbs: Secondary | ICD-10-CM | POA: Diagnosis not present

## 2018-06-23 DIAGNOSIS — M5412 Radiculopathy, cervical region: Secondary | ICD-10-CM | POA: Diagnosis not present

## 2018-06-23 DIAGNOSIS — M549 Dorsalgia, unspecified: Secondary | ICD-10-CM | POA: Diagnosis not present

## 2018-06-24 DIAGNOSIS — M549 Dorsalgia, unspecified: Secondary | ICD-10-CM | POA: Diagnosis not present

## 2018-06-25 DIAGNOSIS — M549 Dorsalgia, unspecified: Secondary | ICD-10-CM | POA: Diagnosis not present

## 2018-06-26 DIAGNOSIS — M549 Dorsalgia, unspecified: Secondary | ICD-10-CM | POA: Diagnosis not present

## 2018-06-27 DIAGNOSIS — M549 Dorsalgia, unspecified: Secondary | ICD-10-CM | POA: Diagnosis not present

## 2018-06-28 DIAGNOSIS — M549 Dorsalgia, unspecified: Secondary | ICD-10-CM | POA: Diagnosis not present

## 2018-06-29 DIAGNOSIS — M549 Dorsalgia, unspecified: Secondary | ICD-10-CM | POA: Diagnosis not present

## 2018-06-30 DIAGNOSIS — M549 Dorsalgia, unspecified: Secondary | ICD-10-CM | POA: Diagnosis not present

## 2018-07-01 DIAGNOSIS — M549 Dorsalgia, unspecified: Secondary | ICD-10-CM | POA: Diagnosis not present

## 2018-07-02 DIAGNOSIS — M549 Dorsalgia, unspecified: Secondary | ICD-10-CM | POA: Diagnosis not present

## 2018-07-03 DIAGNOSIS — M549 Dorsalgia, unspecified: Secondary | ICD-10-CM | POA: Diagnosis not present

## 2018-07-04 DIAGNOSIS — M549 Dorsalgia, unspecified: Secondary | ICD-10-CM | POA: Diagnosis not present

## 2018-07-05 DIAGNOSIS — M549 Dorsalgia, unspecified: Secondary | ICD-10-CM | POA: Diagnosis not present

## 2018-07-06 DIAGNOSIS — M549 Dorsalgia, unspecified: Secondary | ICD-10-CM | POA: Diagnosis not present

## 2018-07-07 DIAGNOSIS — M549 Dorsalgia, unspecified: Secondary | ICD-10-CM | POA: Diagnosis not present

## 2018-07-08 DIAGNOSIS — M549 Dorsalgia, unspecified: Secondary | ICD-10-CM | POA: Diagnosis not present

## 2018-07-09 DIAGNOSIS — M549 Dorsalgia, unspecified: Secondary | ICD-10-CM | POA: Diagnosis not present

## 2018-07-10 DIAGNOSIS — M549 Dorsalgia, unspecified: Secondary | ICD-10-CM | POA: Diagnosis not present

## 2018-07-11 DIAGNOSIS — M549 Dorsalgia, unspecified: Secondary | ICD-10-CM | POA: Diagnosis not present

## 2018-07-12 DIAGNOSIS — M549 Dorsalgia, unspecified: Secondary | ICD-10-CM | POA: Diagnosis not present

## 2018-07-13 DIAGNOSIS — M549 Dorsalgia, unspecified: Secondary | ICD-10-CM | POA: Diagnosis not present

## 2018-07-14 DIAGNOSIS — M549 Dorsalgia, unspecified: Secondary | ICD-10-CM | POA: Diagnosis not present

## 2018-07-15 DIAGNOSIS — M549 Dorsalgia, unspecified: Secondary | ICD-10-CM | POA: Diagnosis not present

## 2018-07-16 DIAGNOSIS — M549 Dorsalgia, unspecified: Secondary | ICD-10-CM | POA: Diagnosis not present

## 2018-07-17 DIAGNOSIS — M549 Dorsalgia, unspecified: Secondary | ICD-10-CM | POA: Diagnosis not present

## 2018-07-18 DIAGNOSIS — M549 Dorsalgia, unspecified: Secondary | ICD-10-CM | POA: Diagnosis not present

## 2018-07-19 DIAGNOSIS — M549 Dorsalgia, unspecified: Secondary | ICD-10-CM | POA: Diagnosis not present

## 2018-07-20 DIAGNOSIS — M549 Dorsalgia, unspecified: Secondary | ICD-10-CM | POA: Diagnosis not present

## 2018-07-21 DIAGNOSIS — M549 Dorsalgia, unspecified: Secondary | ICD-10-CM | POA: Diagnosis not present

## 2018-07-22 DIAGNOSIS — M549 Dorsalgia, unspecified: Secondary | ICD-10-CM | POA: Diagnosis not present

## 2018-07-23 DIAGNOSIS — M549 Dorsalgia, unspecified: Secondary | ICD-10-CM | POA: Diagnosis not present

## 2018-07-24 DIAGNOSIS — M549 Dorsalgia, unspecified: Secondary | ICD-10-CM | POA: Diagnosis not present

## 2018-07-25 DIAGNOSIS — M549 Dorsalgia, unspecified: Secondary | ICD-10-CM | POA: Diagnosis not present

## 2018-07-26 DIAGNOSIS — M549 Dorsalgia, unspecified: Secondary | ICD-10-CM | POA: Diagnosis not present

## 2018-07-27 DIAGNOSIS — M549 Dorsalgia, unspecified: Secondary | ICD-10-CM | POA: Diagnosis not present

## 2018-07-28 DIAGNOSIS — M549 Dorsalgia, unspecified: Secondary | ICD-10-CM | POA: Diagnosis not present

## 2018-07-29 DIAGNOSIS — M549 Dorsalgia, unspecified: Secondary | ICD-10-CM | POA: Diagnosis not present

## 2018-07-30 DIAGNOSIS — M549 Dorsalgia, unspecified: Secondary | ICD-10-CM | POA: Diagnosis not present

## 2018-07-31 DIAGNOSIS — M549 Dorsalgia, unspecified: Secondary | ICD-10-CM | POA: Diagnosis not present

## 2018-08-01 DIAGNOSIS — M549 Dorsalgia, unspecified: Secondary | ICD-10-CM | POA: Diagnosis not present

## 2018-08-02 DIAGNOSIS — M549 Dorsalgia, unspecified: Secondary | ICD-10-CM | POA: Diagnosis not present

## 2018-08-03 DIAGNOSIS — M549 Dorsalgia, unspecified: Secondary | ICD-10-CM | POA: Diagnosis not present

## 2018-08-04 DIAGNOSIS — M549 Dorsalgia, unspecified: Secondary | ICD-10-CM | POA: Diagnosis not present

## 2018-08-05 DIAGNOSIS — M549 Dorsalgia, unspecified: Secondary | ICD-10-CM | POA: Diagnosis not present

## 2018-08-06 DIAGNOSIS — M549 Dorsalgia, unspecified: Secondary | ICD-10-CM | POA: Diagnosis not present

## 2018-08-07 DIAGNOSIS — M549 Dorsalgia, unspecified: Secondary | ICD-10-CM | POA: Diagnosis not present

## 2018-08-08 DIAGNOSIS — M549 Dorsalgia, unspecified: Secondary | ICD-10-CM | POA: Diagnosis not present

## 2018-08-09 DIAGNOSIS — M549 Dorsalgia, unspecified: Secondary | ICD-10-CM | POA: Diagnosis not present

## 2018-08-10 DIAGNOSIS — M549 Dorsalgia, unspecified: Secondary | ICD-10-CM | POA: Diagnosis not present

## 2018-08-11 DIAGNOSIS — M549 Dorsalgia, unspecified: Secondary | ICD-10-CM | POA: Diagnosis not present

## 2018-08-12 DIAGNOSIS — G35 Multiple sclerosis: Secondary | ICD-10-CM | POA: Diagnosis not present

## 2018-08-12 DIAGNOSIS — M549 Dorsalgia, unspecified: Secondary | ICD-10-CM | POA: Diagnosis not present

## 2018-08-13 DIAGNOSIS — M549 Dorsalgia, unspecified: Secondary | ICD-10-CM | POA: Diagnosis not present

## 2018-08-14 DIAGNOSIS — M549 Dorsalgia, unspecified: Secondary | ICD-10-CM | POA: Diagnosis not present

## 2018-08-15 DIAGNOSIS — M549 Dorsalgia, unspecified: Secondary | ICD-10-CM | POA: Diagnosis not present

## 2018-08-16 DIAGNOSIS — M549 Dorsalgia, unspecified: Secondary | ICD-10-CM | POA: Diagnosis not present

## 2018-08-17 DIAGNOSIS — M549 Dorsalgia, unspecified: Secondary | ICD-10-CM | POA: Diagnosis not present

## 2018-08-18 DIAGNOSIS — M549 Dorsalgia, unspecified: Secondary | ICD-10-CM | POA: Diagnosis not present

## 2018-08-19 DIAGNOSIS — M549 Dorsalgia, unspecified: Secondary | ICD-10-CM | POA: Diagnosis not present

## 2018-08-20 DIAGNOSIS — M549 Dorsalgia, unspecified: Secondary | ICD-10-CM | POA: Diagnosis not present

## 2018-08-21 DIAGNOSIS — M549 Dorsalgia, unspecified: Secondary | ICD-10-CM | POA: Diagnosis not present

## 2018-08-22 DIAGNOSIS — M549 Dorsalgia, unspecified: Secondary | ICD-10-CM | POA: Diagnosis not present

## 2018-08-23 DIAGNOSIS — G35 Multiple sclerosis: Secondary | ICD-10-CM | POA: Diagnosis not present

## 2018-08-23 DIAGNOSIS — M5442 Lumbago with sciatica, left side: Secondary | ICD-10-CM | POA: Diagnosis not present

## 2018-08-23 DIAGNOSIS — M5412 Radiculopathy, cervical region: Secondary | ICD-10-CM | POA: Diagnosis not present

## 2018-08-23 DIAGNOSIS — G5603 Carpal tunnel syndrome, bilateral upper limbs: Secondary | ICD-10-CM | POA: Diagnosis not present

## 2018-08-23 DIAGNOSIS — M549 Dorsalgia, unspecified: Secondary | ICD-10-CM | POA: Diagnosis not present

## 2018-08-23 DIAGNOSIS — M5441 Lumbago with sciatica, right side: Secondary | ICD-10-CM | POA: Diagnosis not present

## 2018-08-23 DIAGNOSIS — G4733 Obstructive sleep apnea (adult) (pediatric): Secondary | ICD-10-CM | POA: Diagnosis not present

## 2018-08-24 DIAGNOSIS — M549 Dorsalgia, unspecified: Secondary | ICD-10-CM | POA: Diagnosis not present

## 2018-08-25 DIAGNOSIS — M549 Dorsalgia, unspecified: Secondary | ICD-10-CM | POA: Diagnosis not present

## 2018-08-26 DIAGNOSIS — M549 Dorsalgia, unspecified: Secondary | ICD-10-CM | POA: Diagnosis not present

## 2018-08-27 DIAGNOSIS — M549 Dorsalgia, unspecified: Secondary | ICD-10-CM | POA: Diagnosis not present

## 2018-08-28 DIAGNOSIS — M549 Dorsalgia, unspecified: Secondary | ICD-10-CM | POA: Diagnosis not present

## 2018-08-29 DIAGNOSIS — M549 Dorsalgia, unspecified: Secondary | ICD-10-CM | POA: Diagnosis not present

## 2018-08-30 DIAGNOSIS — M549 Dorsalgia, unspecified: Secondary | ICD-10-CM | POA: Diagnosis not present

## 2018-08-31 DIAGNOSIS — M549 Dorsalgia, unspecified: Secondary | ICD-10-CM | POA: Diagnosis not present

## 2018-09-01 DIAGNOSIS — M549 Dorsalgia, unspecified: Secondary | ICD-10-CM | POA: Diagnosis not present

## 2018-09-02 DIAGNOSIS — M549 Dorsalgia, unspecified: Secondary | ICD-10-CM | POA: Diagnosis not present

## 2018-09-03 DIAGNOSIS — M549 Dorsalgia, unspecified: Secondary | ICD-10-CM | POA: Diagnosis not present

## 2018-09-04 DIAGNOSIS — M549 Dorsalgia, unspecified: Secondary | ICD-10-CM | POA: Diagnosis not present

## 2018-09-05 DIAGNOSIS — M549 Dorsalgia, unspecified: Secondary | ICD-10-CM | POA: Diagnosis not present

## 2018-09-06 DIAGNOSIS — M549 Dorsalgia, unspecified: Secondary | ICD-10-CM | POA: Diagnosis not present

## 2018-09-07 DIAGNOSIS — M549 Dorsalgia, unspecified: Secondary | ICD-10-CM | POA: Diagnosis not present

## 2018-09-08 DIAGNOSIS — M549 Dorsalgia, unspecified: Secondary | ICD-10-CM | POA: Diagnosis not present

## 2018-09-09 DIAGNOSIS — M549 Dorsalgia, unspecified: Secondary | ICD-10-CM | POA: Diagnosis not present

## 2018-09-10 DIAGNOSIS — M549 Dorsalgia, unspecified: Secondary | ICD-10-CM | POA: Diagnosis not present

## 2018-09-11 DIAGNOSIS — M549 Dorsalgia, unspecified: Secondary | ICD-10-CM | POA: Diagnosis not present

## 2018-09-12 DIAGNOSIS — M549 Dorsalgia, unspecified: Secondary | ICD-10-CM | POA: Diagnosis not present

## 2018-09-13 DIAGNOSIS — M549 Dorsalgia, unspecified: Secondary | ICD-10-CM | POA: Diagnosis not present

## 2018-09-14 DIAGNOSIS — M549 Dorsalgia, unspecified: Secondary | ICD-10-CM | POA: Diagnosis not present

## 2018-09-15 DIAGNOSIS — M549 Dorsalgia, unspecified: Secondary | ICD-10-CM | POA: Diagnosis not present

## 2018-09-16 DIAGNOSIS — M549 Dorsalgia, unspecified: Secondary | ICD-10-CM | POA: Diagnosis not present

## 2018-09-17 DIAGNOSIS — M549 Dorsalgia, unspecified: Secondary | ICD-10-CM | POA: Diagnosis not present

## 2018-09-18 DIAGNOSIS — M549 Dorsalgia, unspecified: Secondary | ICD-10-CM | POA: Diagnosis not present

## 2018-09-19 DIAGNOSIS — M549 Dorsalgia, unspecified: Secondary | ICD-10-CM | POA: Diagnosis not present

## 2018-09-20 DIAGNOSIS — M549 Dorsalgia, unspecified: Secondary | ICD-10-CM | POA: Diagnosis not present

## 2018-09-21 DIAGNOSIS — M549 Dorsalgia, unspecified: Secondary | ICD-10-CM | POA: Diagnosis not present

## 2018-09-22 DIAGNOSIS — M549 Dorsalgia, unspecified: Secondary | ICD-10-CM | POA: Diagnosis not present

## 2018-09-23 DIAGNOSIS — G35 Multiple sclerosis: Secondary | ICD-10-CM | POA: Diagnosis not present

## 2018-09-23 DIAGNOSIS — G4733 Obstructive sleep apnea (adult) (pediatric): Secondary | ICD-10-CM | POA: Diagnosis not present

## 2018-09-23 DIAGNOSIS — M5412 Radiculopathy, cervical region: Secondary | ICD-10-CM | POA: Diagnosis not present

## 2018-09-23 DIAGNOSIS — M549 Dorsalgia, unspecified: Secondary | ICD-10-CM | POA: Diagnosis not present

## 2018-09-23 DIAGNOSIS — M5442 Lumbago with sciatica, left side: Secondary | ICD-10-CM | POA: Diagnosis not present

## 2018-09-23 DIAGNOSIS — G5603 Carpal tunnel syndrome, bilateral upper limbs: Secondary | ICD-10-CM | POA: Diagnosis not present

## 2018-09-23 DIAGNOSIS — M5441 Lumbago with sciatica, right side: Secondary | ICD-10-CM | POA: Diagnosis not present

## 2018-09-24 DIAGNOSIS — M5442 Lumbago with sciatica, left side: Secondary | ICD-10-CM | POA: Diagnosis not present

## 2018-09-24 DIAGNOSIS — M549 Dorsalgia, unspecified: Secondary | ICD-10-CM | POA: Diagnosis not present

## 2018-09-25 DIAGNOSIS — M549 Dorsalgia, unspecified: Secondary | ICD-10-CM | POA: Diagnosis not present

## 2018-09-26 DIAGNOSIS — M549 Dorsalgia, unspecified: Secondary | ICD-10-CM | POA: Diagnosis not present

## 2018-10-04 DIAGNOSIS — M549 Dorsalgia, unspecified: Secondary | ICD-10-CM | POA: Diagnosis not present

## 2018-10-05 DIAGNOSIS — M549 Dorsalgia, unspecified: Secondary | ICD-10-CM | POA: Diagnosis not present

## 2018-10-06 DIAGNOSIS — M549 Dorsalgia, unspecified: Secondary | ICD-10-CM | POA: Diagnosis not present

## 2018-10-07 DIAGNOSIS — M549 Dorsalgia, unspecified: Secondary | ICD-10-CM | POA: Diagnosis not present

## 2018-10-08 DIAGNOSIS — M549 Dorsalgia, unspecified: Secondary | ICD-10-CM | POA: Diagnosis not present

## 2018-10-09 DIAGNOSIS — M549 Dorsalgia, unspecified: Secondary | ICD-10-CM | POA: Diagnosis not present

## 2018-10-09 DIAGNOSIS — M5442 Lumbago with sciatica, left side: Secondary | ICD-10-CM | POA: Diagnosis not present

## 2018-10-10 DIAGNOSIS — M549 Dorsalgia, unspecified: Secondary | ICD-10-CM | POA: Diagnosis not present

## 2018-10-11 DIAGNOSIS — M549 Dorsalgia, unspecified: Secondary | ICD-10-CM | POA: Diagnosis not present

## 2018-10-12 DIAGNOSIS — M549 Dorsalgia, unspecified: Secondary | ICD-10-CM | POA: Diagnosis not present

## 2018-10-13 DIAGNOSIS — Z79899 Other long term (current) drug therapy: Secondary | ICD-10-CM | POA: Diagnosis not present

## 2018-10-13 DIAGNOSIS — R202 Paresthesia of skin: Secondary | ICD-10-CM | POA: Diagnosis not present

## 2018-10-13 DIAGNOSIS — G35 Multiple sclerosis: Secondary | ICD-10-CM | POA: Diagnosis not present

## 2018-10-13 DIAGNOSIS — G603 Idiopathic progressive neuropathy: Secondary | ICD-10-CM | POA: Diagnosis not present

## 2018-10-13 DIAGNOSIS — M549 Dorsalgia, unspecified: Secondary | ICD-10-CM | POA: Diagnosis not present

## 2018-10-14 DIAGNOSIS — M549 Dorsalgia, unspecified: Secondary | ICD-10-CM | POA: Diagnosis not present

## 2018-10-15 DIAGNOSIS — M549 Dorsalgia, unspecified: Secondary | ICD-10-CM | POA: Diagnosis not present

## 2018-10-16 DIAGNOSIS — M549 Dorsalgia, unspecified: Secondary | ICD-10-CM | POA: Diagnosis not present

## 2018-10-17 DIAGNOSIS — M549 Dorsalgia, unspecified: Secondary | ICD-10-CM | POA: Diagnosis not present

## 2018-10-18 DIAGNOSIS — M549 Dorsalgia, unspecified: Secondary | ICD-10-CM | POA: Diagnosis not present

## 2018-10-19 DIAGNOSIS — M549 Dorsalgia, unspecified: Secondary | ICD-10-CM | POA: Diagnosis not present

## 2018-10-20 DIAGNOSIS — M549 Dorsalgia, unspecified: Secondary | ICD-10-CM | POA: Diagnosis not present

## 2018-10-21 DIAGNOSIS — M549 Dorsalgia, unspecified: Secondary | ICD-10-CM | POA: Diagnosis not present

## 2018-10-22 DIAGNOSIS — M549 Dorsalgia, unspecified: Secondary | ICD-10-CM | POA: Diagnosis not present

## 2018-10-23 DIAGNOSIS — M549 Dorsalgia, unspecified: Secondary | ICD-10-CM | POA: Diagnosis not present

## 2018-10-24 DIAGNOSIS — M5442 Lumbago with sciatica, left side: Secondary | ICD-10-CM | POA: Diagnosis not present

## 2018-10-24 DIAGNOSIS — G5603 Carpal tunnel syndrome, bilateral upper limbs: Secondary | ICD-10-CM | POA: Diagnosis not present

## 2018-10-24 DIAGNOSIS — M549 Dorsalgia, unspecified: Secondary | ICD-10-CM | POA: Diagnosis not present

## 2018-10-24 DIAGNOSIS — M5412 Radiculopathy, cervical region: Secondary | ICD-10-CM | POA: Diagnosis not present

## 2018-10-24 DIAGNOSIS — G35 Multiple sclerosis: Secondary | ICD-10-CM | POA: Diagnosis not present

## 2018-10-24 DIAGNOSIS — M5441 Lumbago with sciatica, right side: Secondary | ICD-10-CM | POA: Diagnosis not present

## 2018-10-24 DIAGNOSIS — G4733 Obstructive sleep apnea (adult) (pediatric): Secondary | ICD-10-CM | POA: Diagnosis not present

## 2018-10-25 DIAGNOSIS — M549 Dorsalgia, unspecified: Secondary | ICD-10-CM | POA: Diagnosis not present

## 2018-10-26 DIAGNOSIS — M549 Dorsalgia, unspecified: Secondary | ICD-10-CM | POA: Diagnosis not present

## 2018-10-27 DIAGNOSIS — M549 Dorsalgia, unspecified: Secondary | ICD-10-CM | POA: Diagnosis not present

## 2018-10-28 DIAGNOSIS — M549 Dorsalgia, unspecified: Secondary | ICD-10-CM | POA: Diagnosis not present

## 2018-10-29 DIAGNOSIS — M549 Dorsalgia, unspecified: Secondary | ICD-10-CM | POA: Diagnosis not present

## 2018-10-30 DIAGNOSIS — M549 Dorsalgia, unspecified: Secondary | ICD-10-CM | POA: Diagnosis not present

## 2018-10-31 DIAGNOSIS — M549 Dorsalgia, unspecified: Secondary | ICD-10-CM | POA: Diagnosis not present

## 2018-11-01 DIAGNOSIS — M549 Dorsalgia, unspecified: Secondary | ICD-10-CM | POA: Diagnosis not present

## 2018-11-02 DIAGNOSIS — M549 Dorsalgia, unspecified: Secondary | ICD-10-CM | POA: Diagnosis not present

## 2018-11-03 DIAGNOSIS — M549 Dorsalgia, unspecified: Secondary | ICD-10-CM | POA: Diagnosis not present

## 2018-11-04 DIAGNOSIS — M549 Dorsalgia, unspecified: Secondary | ICD-10-CM | POA: Diagnosis not present

## 2018-11-05 DIAGNOSIS — M549 Dorsalgia, unspecified: Secondary | ICD-10-CM | POA: Diagnosis not present

## 2018-11-06 DIAGNOSIS — M549 Dorsalgia, unspecified: Secondary | ICD-10-CM | POA: Diagnosis not present

## 2018-11-07 DIAGNOSIS — M549 Dorsalgia, unspecified: Secondary | ICD-10-CM | POA: Diagnosis not present

## 2018-11-08 DIAGNOSIS — M549 Dorsalgia, unspecified: Secondary | ICD-10-CM | POA: Diagnosis not present

## 2018-11-09 DIAGNOSIS — M549 Dorsalgia, unspecified: Secondary | ICD-10-CM | POA: Diagnosis not present

## 2018-11-10 DIAGNOSIS — M549 Dorsalgia, unspecified: Secondary | ICD-10-CM | POA: Diagnosis not present

## 2018-11-11 DIAGNOSIS — M549 Dorsalgia, unspecified: Secondary | ICD-10-CM | POA: Diagnosis not present

## 2018-11-12 DIAGNOSIS — M549 Dorsalgia, unspecified: Secondary | ICD-10-CM | POA: Diagnosis not present

## 2018-11-13 DIAGNOSIS — M549 Dorsalgia, unspecified: Secondary | ICD-10-CM | POA: Diagnosis not present

## 2018-11-14 DIAGNOSIS — M549 Dorsalgia, unspecified: Secondary | ICD-10-CM | POA: Diagnosis not present

## 2018-11-15 DIAGNOSIS — M549 Dorsalgia, unspecified: Secondary | ICD-10-CM | POA: Diagnosis not present

## 2018-11-16 DIAGNOSIS — M549 Dorsalgia, unspecified: Secondary | ICD-10-CM | POA: Diagnosis not present

## 2018-11-17 DIAGNOSIS — M549 Dorsalgia, unspecified: Secondary | ICD-10-CM | POA: Diagnosis not present

## 2018-11-18 DIAGNOSIS — M549 Dorsalgia, unspecified: Secondary | ICD-10-CM | POA: Diagnosis not present

## 2018-11-19 DIAGNOSIS — M549 Dorsalgia, unspecified: Secondary | ICD-10-CM | POA: Diagnosis not present

## 2018-11-20 DIAGNOSIS — M549 Dorsalgia, unspecified: Secondary | ICD-10-CM | POA: Diagnosis not present

## 2018-11-21 DIAGNOSIS — M549 Dorsalgia, unspecified: Secondary | ICD-10-CM | POA: Diagnosis not present

## 2018-11-22 DIAGNOSIS — M5442 Lumbago with sciatica, left side: Secondary | ICD-10-CM | POA: Diagnosis not present

## 2018-11-22 DIAGNOSIS — G5603 Carpal tunnel syndrome, bilateral upper limbs: Secondary | ICD-10-CM | POA: Diagnosis not present

## 2018-11-22 DIAGNOSIS — M5441 Lumbago with sciatica, right side: Secondary | ICD-10-CM | POA: Diagnosis not present

## 2018-11-22 DIAGNOSIS — M549 Dorsalgia, unspecified: Secondary | ICD-10-CM | POA: Diagnosis not present

## 2018-11-22 DIAGNOSIS — G35 Multiple sclerosis: Secondary | ICD-10-CM | POA: Diagnosis not present

## 2018-11-23 DIAGNOSIS — M549 Dorsalgia, unspecified: Secondary | ICD-10-CM | POA: Diagnosis not present

## 2018-11-24 DIAGNOSIS — M549 Dorsalgia, unspecified: Secondary | ICD-10-CM | POA: Diagnosis not present

## 2018-11-25 DIAGNOSIS — M549 Dorsalgia, unspecified: Secondary | ICD-10-CM | POA: Diagnosis not present

## 2018-11-26 DIAGNOSIS — M549 Dorsalgia, unspecified: Secondary | ICD-10-CM | POA: Diagnosis not present

## 2018-11-27 DIAGNOSIS — M549 Dorsalgia, unspecified: Secondary | ICD-10-CM | POA: Diagnosis not present

## 2018-11-28 DIAGNOSIS — M549 Dorsalgia, unspecified: Secondary | ICD-10-CM | POA: Diagnosis not present

## 2018-12-23 DIAGNOSIS — M5442 Lumbago with sciatica, left side: Secondary | ICD-10-CM | POA: Diagnosis not present

## 2018-12-23 DIAGNOSIS — G5603 Carpal tunnel syndrome, bilateral upper limbs: Secondary | ICD-10-CM | POA: Diagnosis not present

## 2018-12-23 DIAGNOSIS — G35 Multiple sclerosis: Secondary | ICD-10-CM | POA: Diagnosis not present

## 2018-12-23 DIAGNOSIS — M5441 Lumbago with sciatica, right side: Secondary | ICD-10-CM | POA: Diagnosis not present

## 2018-12-24 ENCOUNTER — Other Ambulatory Visit: Payer: Self-pay | Admitting: Specialist

## 2018-12-24 DIAGNOSIS — G35 Multiple sclerosis: Secondary | ICD-10-CM

## 2019-01-03 ENCOUNTER — Other Ambulatory Visit: Payer: Self-pay

## 2019-01-03 ENCOUNTER — Other Ambulatory Visit: Payer: Self-pay | Admitting: Specialist

## 2019-01-03 ENCOUNTER — Ambulatory Visit
Admission: RE | Admit: 2019-01-03 | Discharge: 2019-01-03 | Disposition: A | Discharge status: Home / Self Care | Payer: Self-pay | Source: Ambulatory Visit | Attending: Specialist | Admitting: Specialist

## 2019-01-03 ENCOUNTER — Ambulatory Visit
Admission: RE | Admit: 2019-01-03 | Discharge: 2019-01-03 | Disposition: A | Discharge status: Home / Self Care | Payer: Medicare HMO | Source: Ambulatory Visit | Attending: Specialist | Admitting: Specialist

## 2019-01-03 DIAGNOSIS — R52 Pain, unspecified: Secondary | ICD-10-CM

## 2019-01-03 DIAGNOSIS — G35 Multiple sclerosis: Secondary | ICD-10-CM

## 2019-01-03 MED ORDER — GADOBENATE DIMEGLUMINE 529 MG/ML IV SOLN
15.0000 mL | Freq: Once | INTRAVENOUS | Status: AC | PRN
  Administered 2019-01-03: 11:00:00 15 mL via INTRAVENOUS

## 2019-01-19 DIAGNOSIS — G5603 Carpal tunnel syndrome, bilateral upper limbs: Secondary | ICD-10-CM | POA: Diagnosis not present

## 2019-01-19 DIAGNOSIS — G35 Multiple sclerosis: Secondary | ICD-10-CM | POA: Diagnosis not present

## 2019-01-19 DIAGNOSIS — G603 Idiopathic progressive neuropathy: Secondary | ICD-10-CM | POA: Diagnosis not present

## 2019-01-19 DIAGNOSIS — Z79899 Other long term (current) drug therapy: Secondary | ICD-10-CM | POA: Diagnosis not present

## 2019-01-22 DIAGNOSIS — M5441 Lumbago with sciatica, right side: Secondary | ICD-10-CM | POA: Diagnosis not present

## 2019-01-22 DIAGNOSIS — G5603 Carpal tunnel syndrome, bilateral upper limbs: Secondary | ICD-10-CM | POA: Diagnosis not present

## 2019-01-22 DIAGNOSIS — G35 Multiple sclerosis: Secondary | ICD-10-CM | POA: Diagnosis not present

## 2019-01-22 DIAGNOSIS — M5442 Lumbago with sciatica, left side: Secondary | ICD-10-CM | POA: Diagnosis not present

## 2019-02-07 DIAGNOSIS — M549 Dorsalgia, unspecified: Secondary | ICD-10-CM | POA: Diagnosis not present

## 2019-02-08 DIAGNOSIS — M549 Dorsalgia, unspecified: Secondary | ICD-10-CM | POA: Diagnosis not present

## 2019-02-09 DIAGNOSIS — M549 Dorsalgia, unspecified: Secondary | ICD-10-CM | POA: Diagnosis not present

## 2019-02-10 DIAGNOSIS — G35 Multiple sclerosis: Secondary | ICD-10-CM | POA: Diagnosis not present

## 2019-02-10 DIAGNOSIS — M549 Dorsalgia, unspecified: Secondary | ICD-10-CM | POA: Diagnosis not present

## 2019-02-10 DIAGNOSIS — Z7689 Persons encountering health services in other specified circumstances: Secondary | ICD-10-CM | POA: Diagnosis not present

## 2019-02-11 DIAGNOSIS — M549 Dorsalgia, unspecified: Secondary | ICD-10-CM | POA: Diagnosis not present

## 2019-02-12 DIAGNOSIS — M549 Dorsalgia, unspecified: Secondary | ICD-10-CM | POA: Diagnosis not present

## 2019-02-13 DIAGNOSIS — M549 Dorsalgia, unspecified: Secondary | ICD-10-CM | POA: Diagnosis not present

## 2019-02-14 DIAGNOSIS — M549 Dorsalgia, unspecified: Secondary | ICD-10-CM | POA: Diagnosis not present

## 2019-02-15 DIAGNOSIS — M549 Dorsalgia, unspecified: Secondary | ICD-10-CM | POA: Diagnosis not present

## 2019-02-16 DIAGNOSIS — M549 Dorsalgia, unspecified: Secondary | ICD-10-CM | POA: Diagnosis not present

## 2019-02-17 DIAGNOSIS — M549 Dorsalgia, unspecified: Secondary | ICD-10-CM | POA: Diagnosis not present

## 2019-02-18 DIAGNOSIS — M549 Dorsalgia, unspecified: Secondary | ICD-10-CM | POA: Diagnosis not present

## 2019-02-19 DIAGNOSIS — M549 Dorsalgia, unspecified: Secondary | ICD-10-CM | POA: Diagnosis not present

## 2019-02-20 DIAGNOSIS — M549 Dorsalgia, unspecified: Secondary | ICD-10-CM | POA: Diagnosis not present

## 2019-02-22 DIAGNOSIS — M5441 Lumbago with sciatica, right side: Secondary | ICD-10-CM | POA: Diagnosis not present

## 2019-02-22 DIAGNOSIS — G5603 Carpal tunnel syndrome, bilateral upper limbs: Secondary | ICD-10-CM | POA: Diagnosis not present

## 2019-02-22 DIAGNOSIS — G35 Multiple sclerosis: Secondary | ICD-10-CM | POA: Diagnosis not present

## 2019-02-22 DIAGNOSIS — M5442 Lumbago with sciatica, left side: Secondary | ICD-10-CM | POA: Diagnosis not present

## 2019-03-07 DIAGNOSIS — M549 Dorsalgia, unspecified: Secondary | ICD-10-CM | POA: Diagnosis not present

## 2019-03-08 DIAGNOSIS — M549 Dorsalgia, unspecified: Secondary | ICD-10-CM | POA: Diagnosis not present

## 2019-03-09 DIAGNOSIS — M549 Dorsalgia, unspecified: Secondary | ICD-10-CM | POA: Diagnosis not present

## 2019-03-10 DIAGNOSIS — M549 Dorsalgia, unspecified: Secondary | ICD-10-CM | POA: Diagnosis not present

## 2019-03-11 DIAGNOSIS — M549 Dorsalgia, unspecified: Secondary | ICD-10-CM | POA: Diagnosis not present

## 2019-03-12 DIAGNOSIS — M549 Dorsalgia, unspecified: Secondary | ICD-10-CM | POA: Diagnosis not present

## 2019-03-13 DIAGNOSIS — M549 Dorsalgia, unspecified: Secondary | ICD-10-CM | POA: Diagnosis not present

## 2019-03-14 DIAGNOSIS — M549 Dorsalgia, unspecified: Secondary | ICD-10-CM | POA: Diagnosis not present

## 2019-03-15 DIAGNOSIS — M549 Dorsalgia, unspecified: Secondary | ICD-10-CM | POA: Diagnosis not present

## 2019-03-16 DIAGNOSIS — M549 Dorsalgia, unspecified: Secondary | ICD-10-CM | POA: Diagnosis not present

## 2019-03-17 DIAGNOSIS — M549 Dorsalgia, unspecified: Secondary | ICD-10-CM | POA: Diagnosis not present

## 2019-03-18 DIAGNOSIS — M549 Dorsalgia, unspecified: Secondary | ICD-10-CM | POA: Diagnosis not present

## 2019-03-19 DIAGNOSIS — M549 Dorsalgia, unspecified: Secondary | ICD-10-CM | POA: Diagnosis not present

## 2019-03-20 DIAGNOSIS — M549 Dorsalgia, unspecified: Secondary | ICD-10-CM | POA: Diagnosis not present

## 2019-03-21 DIAGNOSIS — R600 Localized edema: Secondary | ICD-10-CM | POA: Diagnosis not present

## 2019-03-21 DIAGNOSIS — G35 Multiple sclerosis: Secondary | ICD-10-CM | POA: Diagnosis not present

## 2019-03-21 DIAGNOSIS — I10 Essential (primary) hypertension: Secondary | ICD-10-CM | POA: Diagnosis not present

## 2019-03-24 DIAGNOSIS — M5441 Lumbago with sciatica, right side: Secondary | ICD-10-CM | POA: Diagnosis not present

## 2019-03-24 DIAGNOSIS — G35 Multiple sclerosis: Secondary | ICD-10-CM | POA: Diagnosis not present

## 2019-03-24 DIAGNOSIS — M5442 Lumbago with sciatica, left side: Secondary | ICD-10-CM | POA: Diagnosis not present

## 2019-03-24 DIAGNOSIS — G5603 Carpal tunnel syndrome, bilateral upper limbs: Secondary | ICD-10-CM | POA: Diagnosis not present

## 2019-04-05 DIAGNOSIS — I10 Essential (primary) hypertension: Secondary | ICD-10-CM | POA: Diagnosis not present

## 2019-04-05 DIAGNOSIS — G35 Multiple sclerosis: Secondary | ICD-10-CM | POA: Diagnosis not present

## 2019-04-05 DIAGNOSIS — R600 Localized edema: Secondary | ICD-10-CM | POA: Diagnosis not present

## 2019-04-18 DIAGNOSIS — Z125 Encounter for screening for malignant neoplasm of prostate: Secondary | ICD-10-CM | POA: Diagnosis not present

## 2019-04-18 DIAGNOSIS — E7849 Other hyperlipidemia: Secondary | ICD-10-CM | POA: Diagnosis not present

## 2019-04-18 DIAGNOSIS — E559 Vitamin D deficiency, unspecified: Secondary | ICD-10-CM | POA: Diagnosis not present

## 2019-04-24 DIAGNOSIS — M5441 Lumbago with sciatica, right side: Secondary | ICD-10-CM | POA: Diagnosis not present

## 2019-04-24 DIAGNOSIS — G35 Multiple sclerosis: Secondary | ICD-10-CM | POA: Diagnosis not present

## 2019-04-24 DIAGNOSIS — G5603 Carpal tunnel syndrome, bilateral upper limbs: Secondary | ICD-10-CM | POA: Diagnosis not present

## 2019-04-24 DIAGNOSIS — M5442 Lumbago with sciatica, left side: Secondary | ICD-10-CM | POA: Diagnosis not present

## 2019-04-25 DIAGNOSIS — G35 Multiple sclerosis: Secondary | ICD-10-CM | POA: Diagnosis not present

## 2019-04-25 DIAGNOSIS — F329 Major depressive disorder, single episode, unspecified: Secondary | ICD-10-CM | POA: Diagnosis not present

## 2019-04-25 DIAGNOSIS — E785 Hyperlipidemia, unspecified: Secondary | ICD-10-CM | POA: Diagnosis not present

## 2019-04-25 DIAGNOSIS — E559 Vitamin D deficiency, unspecified: Secondary | ICD-10-CM | POA: Diagnosis not present

## 2019-04-25 DIAGNOSIS — Z0000 Encounter for general adult medical examination without abnormal findings: Secondary | ICD-10-CM | POA: Diagnosis not present

## 2019-04-25 DIAGNOSIS — L89159 Pressure ulcer of sacral region, unspecified stage: Secondary | ICD-10-CM | POA: Diagnosis not present

## 2019-04-25 DIAGNOSIS — G4733 Obstructive sleep apnea (adult) (pediatric): Secondary | ICD-10-CM | POA: Diagnosis not present

## 2019-04-25 DIAGNOSIS — I10 Essential (primary) hypertension: Secondary | ICD-10-CM | POA: Diagnosis not present

## 2019-04-25 DIAGNOSIS — N529 Male erectile dysfunction, unspecified: Secondary | ICD-10-CM | POA: Diagnosis not present

## 2019-05-12 DIAGNOSIS — G35 Multiple sclerosis: Secondary | ICD-10-CM | POA: Diagnosis not present

## 2019-05-12 DIAGNOSIS — M5417 Radiculopathy, lumbosacral region: Secondary | ICD-10-CM | POA: Diagnosis not present

## 2019-05-12 DIAGNOSIS — R300 Dysuria: Secondary | ICD-10-CM | POA: Diagnosis not present

## 2019-05-12 DIAGNOSIS — R5383 Other fatigue: Secondary | ICD-10-CM | POA: Diagnosis not present

## 2019-05-12 DIAGNOSIS — Z79899 Other long term (current) drug therapy: Secondary | ICD-10-CM | POA: Diagnosis not present

## 2019-05-12 DIAGNOSIS — G603 Idiopathic progressive neuropathy: Secondary | ICD-10-CM | POA: Diagnosis not present

## 2019-05-25 DIAGNOSIS — G35 Multiple sclerosis: Secondary | ICD-10-CM | POA: Diagnosis not present

## 2019-05-25 DIAGNOSIS — M5442 Lumbago with sciatica, left side: Secondary | ICD-10-CM | POA: Diagnosis not present

## 2019-05-25 DIAGNOSIS — G5603 Carpal tunnel syndrome, bilateral upper limbs: Secondary | ICD-10-CM | POA: Diagnosis not present

## 2019-05-25 DIAGNOSIS — M5441 Lumbago with sciatica, right side: Secondary | ICD-10-CM | POA: Diagnosis not present

## 2019-12-01 ENCOUNTER — Other Ambulatory Visit: Payer: Self-pay | Admitting: Specialist

## 2019-12-05 ENCOUNTER — Other Ambulatory Visit: Payer: Self-pay | Admitting: Specialist

## 2019-12-05 DIAGNOSIS — G35 Multiple sclerosis: Secondary | ICD-10-CM

## 2019-12-30 ENCOUNTER — Inpatient Hospital Stay: Admission: RE | Admit: 2019-12-30 | Payer: Self-pay | Source: Ambulatory Visit

## 2019-12-30 ENCOUNTER — Other Ambulatory Visit: Payer: Self-pay

## 2020-03-14 ENCOUNTER — Other Ambulatory Visit: Payer: Self-pay | Admitting: Specialist

## 2020-03-14 DIAGNOSIS — G35 Multiple sclerosis: Secondary | ICD-10-CM

## 2020-04-14 ENCOUNTER — Other Ambulatory Visit: Payer: Self-pay

## 2020-04-14 ENCOUNTER — Ambulatory Visit
Admission: RE | Admit: 2020-04-14 | Discharge: 2020-04-14 | Disposition: A | Discharge status: Home / Self Care | Payer: Medicare HMO | Source: Ambulatory Visit | Attending: Specialist | Admitting: Specialist

## 2020-04-14 DIAGNOSIS — G35 Multiple sclerosis: Secondary | ICD-10-CM

## 2020-04-14 MED ORDER — GADOBENATE DIMEGLUMINE 529 MG/ML IV SOLN
15.0000 mL | Freq: Once | INTRAVENOUS | Status: AC | PRN
  Administered 2020-04-14: 15 mL via INTRAVENOUS

## 2020-04-15 ENCOUNTER — Ambulatory Visit
Admission: EM | Admit: 2020-04-15 | Discharge: 2020-04-15 | Disposition: A | Discharge status: Home / Self Care | Payer: Medicare Other | Attending: Physician Assistant | Admitting: Physician Assistant

## 2020-04-15 ENCOUNTER — Encounter: Payer: Self-pay | Admitting: Emergency Medicine

## 2020-04-15 ENCOUNTER — Other Ambulatory Visit: Payer: Self-pay

## 2020-04-15 DIAGNOSIS — L03114 Cellulitis of left upper limb: Secondary | ICD-10-CM

## 2020-04-15 HISTORY — DX: Multiple sclerosis: G35

## 2020-04-15 MED ORDER — MUPIROCIN 2 % EX OINT: 1.0000 "application " | TOPICAL_OINTMENT | Freq: Two times a day (BID) | CUTANEOUS | 0 refills | Status: DC

## 2020-04-15 NOTE — Discharge Instructions (Signed)
Continue doxycycline. Bactroban to dress wound. Keep wound clean and dry. You can clean gently with soap and water. Do not soak area in water. Change dressing 1-2x a day. Follow up with PCP this week for recheck. If having fever, worsening symptoms, go to the ED for further evaluation.

## 2020-04-15 NOTE — ED Provider Notes (Signed)
[facility]    CSN: [id number] Arrival date & time: 04/15/20  1208      History   Chief Complaint Chief Complaint  Patient presents with  . Wound Check    HPI Anthony Henry is a 44 y.o. male.   44 year old male comes in for left upper arm abscess that self drained. States this first started as an insect bite a few days ago. Discussed this with PCP, and was put empirically on doxycycline. Has now noticed redness/hardness around the area. Had been taking abx as directed, doing warm compresses. States over night, abscess self drained, and hardness has improved since then. Denies fevers.      Past Medical History:  Diagnosis Date  . Multiple sclerosis (HCC)     There are no problems to display for this patient.   History reviewed. No pertinent surgical history.     Home Medications    Prior to Admission medications   Medication Sig Start Date End Date Taking? Authorizing Provider  baclofen (LIORESAL) 10 MG tablet Take 10 mg by mouth 3 (three) times daily.   Yes [provider]  dalfampridine 10 MG TB12 Take 10 mg by mouth.   Yes [provider]  desmopressin (DDAVP) 0.1 MG tablet Take 0.2 mg by mouth daily.    Yes [provider]  doxycycline (VIBRAMYCIN) 100 MG capsule Take 100 mg by mouth 2 (two) times daily.   Yes [provider]  methylphenidate (RITALIN) 10 MG tablet Take 10 mg by mouth 2 (two) times daily.   Yes [provider]  rosuvastatin (CRESTOR) 40 MG tablet Take 10 mg by mouth daily.    Yes [provider]  tizanidine (ZANAFLEX) 2 MG capsule Take 2 mg by mouth 3 (three) times daily.   Yes [provider]  mupirocin ointment (BACTROBAN) 2 % Apply 1 application topically 2 (two) times daily. 04/15/20   Belinda Fisher, PA-C    Family History History reviewed. No pertinent family history.  Social History Social History   Tobacco Use  . Smoking status: Never Smoker  . Smokeless tobacco:  Never Used  Substance Use Topics  . Alcohol use: Not Currently  . Drug use: Never     Allergies   Penicillins   Review of Systems Review of Systems   Physical Exam Triage Vital Signs ED Triage Vitals  Enc Vitals Group     BP 04/15/20 1240 127/77     Pulse Rate 04/15/20 1240 96     Resp 04/15/20 1240 18     Temp 04/15/20 1240 98.5 F (36.9 C)     Temp Source 04/15/20 1240 Oral     SpO2 04/15/20 1240 97 %     Weight --      Height --      Head Circumference --      Peak Flow --      Pain Score 04/15/20 1241 5     Pain Loc --      Pain Edu? --      Excl. in GC? --    No data found.  Updated Vital Signs BP 127/77 (BP Location: Right Arm)   Pulse 96   Temp 98.5 F (36.9 C) (Oral)   Resp 18   SpO2 97%   Physical Exam Constitutional:      General: He is not in acute distress.    Appearance: Normal appearance. He is well-developed. He is not toxic-appearing or diaphoretic.  HENT:  Head: Normocephalic and atraumatic.  Eyes:     Conjunctiva/sclera: Conjunctivae normal.     Pupils: Pupils are equal, round, and reactive to light.  Pulmonary:     Effort: Pulmonary effort is normal. No respiratory distress.  Musculoskeletal:     Cervical back: Normal range of motion and neck supple.  Skin:    General: Skin is warm and dry.     Comments: See picture below. Self drained abscess without active drainage. No fluctuance felt. Approx 10cm surrounding cellulitis.   Neurological:     Mental Status: He is alert and oriented to person, place, and time.        UC Treatments / Results  Labs (all labs ordered are listed, but only abnormal results are displayed) Labs Reviewed  AEROBIC CULTURE (SUPERFICIAL SPECIMEN)    EKG   Radiology   Procedures Procedures (including critical care time)  Medications Ordered in UC Medications - No data to display  Initial Impression / Assessment and Plan / UC Course  I have reviewed the triage vital signs and the  nursing notes.  Pertinent labs & imaging results that were available during my care of the patient were reviewed by me and considered in my medical decision making (see chart for details).    Patient states since starting doxycycline, induration has improved.  He denies spreading erythema, fever.  Abscess self drained, no further fluctuance felt.  Offered I&D to extend opening, for which patient deferred for now. Wound culture obtained. Return precautions given.  Case discussed with Dr Leonides Grills, who agrees to plan.  Final Clinical Impressions(s) / UC Diagnoses   Final diagnoses:  Cellulitis of left upper extremity    ED Prescriptions    Medication Sig Dispense Auth. Provider   mupirocin ointment (BACTROBAN) 2 % Apply 1 application topically 2 (two) times daily. 22 g Belinda Fisher, PA-C     PDMP not reviewed this encounter.   Belinda Fisher, PA-C 04/15/20 1454

## 2020-04-15 NOTE — ED Triage Notes (Signed)
Pt here with wound possible abscess to left upper arm that started draining last night; pt is taking 2 antibiotics

## 2020-04-18 LAB — AEROBIC CULTURE W GRAM STAIN (SUPERFICIAL SPECIMEN)

## 2020-10-08 ENCOUNTER — Encounter: Payer: Self-pay | Admitting: Neurology

## 2020-10-09 ENCOUNTER — Encounter: Payer: Self-pay | Admitting: Neurology

## 2020-10-09 ENCOUNTER — Institutional Professional Consult (permissible substitution): Payer: Medicare Other | Admitting: Neurology

## 2020-10-09 ENCOUNTER — Telehealth: Payer: Self-pay | Admitting: Neurology

## 2020-10-09 NOTE — Telephone Encounter (Signed)
Patient was a no-show to the sleep consult today

## 2021-05-16 ENCOUNTER — Other Ambulatory Visit: Payer: Self-pay | Admitting: Specialist

## 2021-05-16 DIAGNOSIS — G35 Multiple sclerosis: Secondary | ICD-10-CM

## 2021-06-10 ENCOUNTER — Inpatient Hospital Stay: Admission: RE | Admit: 2021-06-10 | Payer: Medicare Other | Source: Ambulatory Visit

## 2021-08-06 ENCOUNTER — Other Ambulatory Visit: Payer: Medicare Other

## 2021-08-07 ENCOUNTER — Inpatient Hospital Stay: Admission: RE | Admit: 2021-08-07 | Payer: Medicare Other | Source: Ambulatory Visit

## 2021-10-11 ENCOUNTER — Other Ambulatory Visit: Payer: Self-pay | Admitting: Specialist

## 2021-10-16 ENCOUNTER — Ambulatory Visit
Admission: RE | Admit: 2021-10-16 | Discharge: 2021-10-16 | Disposition: A | Discharge status: Home / Self Care | Payer: Medicare Other | Source: Ambulatory Visit | Attending: Specialist | Admitting: Specialist

## 2021-10-16 DIAGNOSIS — G35 Multiple sclerosis: Secondary | ICD-10-CM

## 2021-10-16 MED ORDER — GADOBENATE DIMEGLUMINE 529 MG/ML IV SOLN
15.0000 mL | Freq: Once | INTRAVENOUS | Status: AC | PRN
  Administered 2021-10-16: 15 mL via INTRAVENOUS

## 2022-12-17 ENCOUNTER — Encounter: Payer: Self-pay | Admitting: Internal Medicine

## 2022-12-17 ENCOUNTER — Other Ambulatory Visit: Payer: Self-pay | Admitting: Internal Medicine

## 2022-12-17 DIAGNOSIS — N1831 Chronic kidney disease, stage 3a: Secondary | ICD-10-CM

## 2022-12-23 ENCOUNTER — Other Ambulatory Visit (HOSPITAL_COMMUNITY): Payer: Self-pay | Admitting: Internal Medicine

## 2022-12-23 ENCOUNTER — Encounter (HOSPITAL_COMMUNITY): Payer: Medicare Other

## 2022-12-23 ENCOUNTER — Ambulatory Visit (HOSPITAL_COMMUNITY)
Admission: RE | Admit: 2022-12-23 | Discharge: 2022-12-23 | Disposition: A | Discharge status: Home / Self Care | Payer: 59 | Source: Ambulatory Visit | Attending: Cardiology | Admitting: Cardiology

## 2022-12-23 DIAGNOSIS — R600 Localized edema: Secondary | ICD-10-CM | POA: Diagnosis present

## 2022-12-31 ENCOUNTER — Encounter (HOSPITAL_COMMUNITY): Payer: Self-pay

## 2023-01-13 ENCOUNTER — Inpatient Hospital Stay: Admission: RE | Admit: 2023-01-13 | Payer: 59 | Source: Ambulatory Visit

## 2023-01-19 ENCOUNTER — Ambulatory Visit
Admission: RE | Admit: 2023-01-19 | Discharge: 2023-01-19 | Disposition: A | Discharge status: Home / Self Care | Payer: 59 | Source: Ambulatory Visit | Attending: Internal Medicine | Admitting: Internal Medicine

## 2023-01-19 DIAGNOSIS — N1831 Chronic kidney disease, stage 3a: Secondary | ICD-10-CM

## 2023-01-21 ENCOUNTER — Encounter: Payer: Self-pay | Admitting: *Deleted

## 2023-01-22 ENCOUNTER — Institutional Professional Consult (permissible substitution): Payer: 59 | Admitting: Neurology

## 2023-01-22 ENCOUNTER — Encounter: Payer: Self-pay | Admitting: Neurology

## 2023-02-03 NOTE — Progress Notes (Deleted)
[facility]   [phone] [phone]   Clinic New consult Note   Patient Care Team: Pcp, No as PCP - General 02/03/2023  CHIEF COMPLAINTS/PURPOSE OF CONSULTATION:  Abnormal blood work, referred by PCP Dr. Jarome Matin for [facility]  HISTORY OF PRESENTING ILLNESS:  Anthony Henry 47 y.o. male with PMH including multiple sclerosis, adrenal insufficiency, HTN, HL, CKD, depression, headaches, vit D deficiency, and OSA is here because of abnormal blood work at annual physical. CMP showed elevations in Scr 1.6, calcium 10.9, and total protein 8.7. 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 He has leukocytosis   ***He was found to have abnormal CBC from *** ***He denies recent chest pain on exertion, shortness of breath on minimal exertion, pre-syncopal episodes, or palpitations. ***He had not noticed any recent bleeding such as epistaxis, hematuria or hematochezia ***The patient denies over the counter NSAID ingestion. He is not *** on antiplatelets agents. His last colonoscopy was *** ***He had no prior history or diagnosis of cancer. His age appropriate screening programs are up-to-date. ***He denies any pica and eats a variety of diet. ***He never donated blood or received blood transfusion ***The patient was prescribed oral iron supplements and he takes ***  MEDICAL HISTORY:  Past Medical History:  Diagnosis Date   Adrenal insufficiency (HCC)    Depression    ED (erectile dysfunction)    Headache    Multiple sclerosis (HCC)    since age 37 yr   Vitamin D deficiency     SURGICAL HISTORY: No past surgical history on file.  SOCIAL HISTORY: Social History   Socioeconomic History   Marital status: Single    Spouse name: Not on file   Number of children: Not on file    Years of education: Not on file   Highest education level: Not on file  Occupational History   Not on file  Tobacco Use   Smoking status: Never   Smokeless tobacco: Never  Substance and Sexual Activity   Alcohol use: Not Currently   Drug use: Never   Sexual activity: Not on file  Other Topics Concern   Not on file  Social History Narrative   Not on file   Social Determinants of Health   Financial Resource Strain: Not on file  Food Insecurity: Not on file  Transportation Needs: Not on file  Physical Activity: Not on file  Stress: Not on file  Social Connections: Not on file  Intimate Partner Violence: Not on file    FAMILY HISTORY: Family History  Problem Relation Age of Onset   Suicidality Brother        died at age 57  2023/08/08    ALLERGIES:  is allergic to penicillins.  MEDICATIONS:  Current Outpatient Medications  Medication Sig Dispense Refill   cetirizine (ZYRTEC) 10 MG tablet Take 10 mg by mouth daily as needed for allergies.     dalfampridine 10 MG TB12 Take 10 mg by mouth.     Fingolimod HCl (GILENYA) 0.5 MG CAPS Take 1 capsule by mouth daily.     furosemide (LASIX) 40 MG tablet Take 40 mg by mouth daily.     loratadine (CLARITIN) 10 MG tablet Take 10 mg by mouth daily as needed for allergies.     methylphenidate (RITALIN) 10 MG tablet Take 10 mg by mouth daily.  mometasone (ELOCON) 0.1 % cream Apply 1 application topically 2 (two) times daily.     montelukast (SINGULAIR) 10 MG tablet Take 10 mg by mouth at bedtime.     multivitamin (ONE-A-DAY MEN'S) TABS tablet Take 1 tablet by mouth daily.     naproxen (NAPROSYN) 500 MG tablet Take 500 mg by mouth 2 (two) times daily with a meal.     olopatadine (PATANOL) 0.1 % ophthalmic solution Place 1 drop into both eyes 2 (two) times daily as needed for allergies.     rosuvastatin (CRESTOR) 10 MG tablet Take 10 mg by mouth daily.      sildenafil (VIAGRA) 100 MG tablet Take 100 mg by mouth daily as needed for erectile  dysfunction.     tizanidine (ZANAFLEX) 2 MG capsule Take 4 mg by mouth in the morning, at noon, in the evening, and at bedtime.     triamcinolone (KENALOG) 0.1 % Apply 1 application topically 2 (two) times daily.     Vitamin D, Ergocalciferol, (DRISDOL) 1.25 MG (50000 UNIT) CAPS capsule Take 50,000 Units by mouth every 7 (seven) days.     No current facility-administered medications for this visit.    REVIEW OF SYSTEMS:   Constitutional: Denies fevers, chills or abnormal night sweats Eyes: Denies blurriness of vision, double vision or watery eyes Ears, nose, mouth, throat, and face: Denies mucositis or sore throat Respiratory: Denies cough, dyspnea or wheezes Cardiovascular: Denies palpitation, chest discomfort or lower extremity swelling Gastrointestinal:  Denies nausea, heartburn or change in bowel habits Skin: Denies abnormal skin rashes Lymphatics: Denies new lymphadenopathy or easy bruising Neurological:Denies numbness, tingling or new weaknesses Behavioral/Psych: Mood is stable, no new changes  All other systems were reviewed with the patient and are negative.  PHYSICAL EXAMINATION: ECOG PERFORMANCE STATUS: {CHL ONC ECOG PS:[id number]}  There were no vitals filed for this visit. There were no vitals filed for this visit.  GENERAL:alert, no distress and comfortable SKIN: skin color, texture, turgor are normal, no rashes or significant lesions EYES: normal, conjunctiva are pink and non-injected, sclera clear OROPHARYNX:no exudate, no erythema and lips, buccal mucosa, and tongue normal  NECK: supple, thyroid normal size, non-tender, without nodularity LYMPH:  no palpable lymphadenopathy in the cervical, axillary or inguinal LUNGS: clear to auscultation and percussion with normal breathing effort HEART: regular rate & rhythm and no murmurs and no lower extremity edema ABDOMEN:abdomen soft, non-tender and normal bowel sounds Musculoskeletal:no cyanosis of digits and no clubbing   PSYCH: alert & oriented x 3 with fluent speech NEURO: no focal motor/sensory deficits  LABORATORY DATA:  I have reviewed the data as listed     No data to display              No data to display           RADIOGRAPHIC STUDIES: I have personally reviewed the radiological images as listed and agreed with the findings in the report. US RENAL  Result Date: 01/19/2023 CLINICAL DATA:  Chronic renal disease EXAM: RENAL / URINARY TRACT ULTRASOUND COMPLETE COMPARISON:  None Available. FINDINGS: Right Kidney: Renal measurements: 10.1 x 4.8 x 5.0 cm = volume: 127 mL. Echogenicity within normal limits. No mass or hydronephrosis visualized. Left Kidney: Renal measurements: 10.1 x 5.2 x 4.8 cm = volume: 131 mL. Echogenicity within normal limits. No mass or hydronephrosis visualized. Bladder: Appears normal for degree of bladder distention. Other: None. IMPRESSION: No hydronephrosis. Electronically Signed   By: Annia Belt M.D.   On: 01/19/2023 15:41  ASSESSMENT & PLAN:  No problem-specific Assessment & Plan notes found for this encounter.    All questions were answered. The patient knows to call the clinic with any problems, questions or concerns. I spent {CHL ONC TIME VISIT - NWGNF:[id number]} counseling the patient face to face. The total time spent in the appointment was {CHL ONC TIME VISIT - IONGE:[id number]} and more than 50% was on counseling.     Pollyann Samples, NP 02/03/23 10:11 AM

## 2023-02-04 ENCOUNTER — Inpatient Hospital Stay: Payer: 59 | Admitting: Nurse Practitioner

## 2023-02-04 ENCOUNTER — Inpatient Hospital Stay: Payer: 59

## 2023-03-11 ENCOUNTER — Telehealth: Payer: Self-pay

## 2023-03-11 ENCOUNTER — Inpatient Hospital Stay: Payer: 59

## 2023-03-11 ENCOUNTER — Inpatient Hospital Stay: Payer: 59 | Attending: Nurse Practitioner | Admitting: Hematology

## 2023-03-11 NOTE — Progress Notes (Deleted)
[facility]   Telephone:([phone] Fax:(336) [phone]   Clinic New Consult Note   Patient Care Team: Pcp, No as PCP - General  Date of Service:  03/11/2023   CHIEF COMPLAINTS/PURPOSE OF CONSULTATION:  ***  REFERRING PHYSICIAN:  ***  HISTORY OF PRESENTING ILLNESS: *** Anthony Henry 47 y.o. male is a here because of ***. The patient was referred by ***. The patient presents to the clinic today accompanied by ***.   {Story of what lead them here}   Today the patient notes they felt/feeling prior/after...    {She/He} has a PMHx of....    Socially...    REVIEW OF SYSTEMS:  *** Constitutional: Denies fevers, chills or abnormal night sweats Eyes: Denies blurriness of vision, double vision or watery eyes Ears, nose, mouth, throat, and face: Denies mucositis or sore throat Respiratory: Denies cough, dyspnea or wheezes Cardiovascular: Denies palpitation, chest discomfort or lower extremity swelling Gastrointestinal:  Denies nausea, heartburn or change in bowel habits Skin: Denies abnormal skin rashes Lymphatics: Denies new lymphadenopathy or easy bruising Neurological:Denies numbness, tingling or new weaknesses Behavioral/Psych: Mood is stable, no new changes  All other systems were reviewed with the patient and are negative.   MEDICAL HISTORY:  Past Medical History:  Diagnosis Date   Adrenal insufficiency (HCC)    Depression    ED (erectile dysfunction)    Headache    Multiple sclerosis (HCC)    since age 79 yr   Vitamin D deficiency     SURGICAL HISTORY: No past surgical history on file.  SOCIAL HISTORY: Social History   Socioeconomic History   Marital status: Single    Spouse name: Not on file   Number of children: Not on file   Years of education: Not on file   Highest education level: Not on file  Occupational History   Not on file  Tobacco Use   Smoking status: Never   Smokeless tobacco: Never  Substance and Sexual Activity    Alcohol use: Not Currently   Drug use: Never   Sexual activity: Not on file  Other Topics Concern   Not on file  Social History Narrative   Not on file   Social Determinants of Health   Financial Resource Strain: Not on file  Food Insecurity: Not on file  Transportation Needs: Not on file  Physical Activity: Not on file  Stress: Not on file  Social Connections: Not on file  Intimate Partner Violence: Not on file    FAMILY HISTORY: Family History  Problem Relation Age of Onset   Suicidality Brother        died at age 17  07-22-23    ALLERGIES:  is allergic to penicillins.  MEDICATIONS:  Current Outpatient Medications  Medication Sig Dispense Refill   cetirizine (ZYRTEC) 10 MG tablet Take 10 mg by mouth daily as needed for allergies.     dalfampridine 10 MG TB12 Take 10 mg by mouth.     Fingolimod HCl (GILENYA) 0.5 MG CAPS Take 1 capsule by mouth daily.     furosemide (LASIX) 40 MG tablet Take 40 mg by mouth daily.     loratadine (CLARITIN) 10 MG tablet Take 10 mg by mouth daily as needed for allergies.     methylphenidate (RITALIN) 10 MG tablet Take 10 mg by mouth daily.     mometasone (ELOCON) 0.1 % cream Apply 1 application topically 2 (two) times daily.     montelukast (SINGULAIR) 10 MG tablet Take 10  mg by mouth at bedtime.     multivitamin (ONE-A-DAY MEN'S) TABS tablet Take 1 tablet by mouth daily.     naproxen (NAPROSYN) 500 MG tablet Take 500 mg by mouth 2 (two) times daily with a meal.     olopatadine (PATANOL) 0.1 % ophthalmic solution Place 1 drop into both eyes 2 (two) times daily as needed for allergies.     rosuvastatin (CRESTOR) 10 MG tablet Take 10 mg by mouth daily.      sildenafil (VIAGRA) 100 MG tablet Take 100 mg by mouth daily as needed for erectile dysfunction.     tizanidine (ZANAFLEX) 2 MG capsule Take 4 mg by mouth in the morning, at noon, in the evening, and at bedtime.     triamcinolone (KENALOG) 0.1 % Apply 1 application topically 2 (two) times  daily.     Vitamin D, Ergocalciferol, (DRISDOL) 1.25 MG (50000 UNIT) CAPS capsule Take 50,000 Units by mouth every 7 (seven) days.     No current facility-administered medications for this visit.    PHYSICAL EXAMINATION: ECOG PERFORMANCE STATUS: {CHL ONC ECOG PS:[id number]}  There were no vitals filed for this visit. There were no vitals filed for this visit. *** GENERAL:alert, no distress and comfortable SKIN: skin color, texture, turgor are normal, no rashes or significant lesions EYES: normal, Conjunctiva are pink and non-injected, sclera clear {OROPHARYNX:no exudate, no erythema and lips, buccal mucosa, and tongue normal}  NECK: supple, thyroid normal size, non-tender, without nodularity LYMPH:  no palpable lymphadenopathy in the cervical, axillary {or inguinal} LUNGS: clear to auscultation and percussion with normal breathing effort HEART: regular rate & rhythm and no murmurs and no lower extremity edema ABDOMEN:abdomen soft, non-tender and normal bowel sounds Musculoskeletal:no cyanosis of digits and no clubbing  NEURO: alert & oriented x 3 with fluent speech, no focal motor/sensory deficits  LABORATORY DATA:  I have reviewed the data as listed     No data to display              No data to display           RADIOGRAPHIC STUDIES: I have personally reviewed the radiological images as listed and agreed with the findings in the report. No results found.  ASSESSMENT & PLAN:  Anthony Henry is a 47 y.o. *** male with a history of ***   1 ***      PLAN:  ***    No orders of the defined types were placed in this encounter.   All questions were answered. The patient knows to call the clinic with any problems, questions or concerns. The total time spent in the appointment was {CHL ONC TIME VISIT - MVHQI:[id number]}.     Salome Holmes, CMA 03/11/2023 2:44 PM  I, Monica Martinez, am acting as scribe for Malachy Mood, MD.   {Add scribe attestation  statement}  -

## 2023-03-11 NOTE — Telephone Encounter (Signed)
LVM letting pt know he had an appt scheduled today w/Dr. Mosetta Putt.  Stated that someone from our Scheduling Team will be contacting him to get him rescheduled.

## 2024-07-20 ENCOUNTER — Other Ambulatory Visit: Payer: Self-pay | Admitting: Physician Assistant

## 2024-07-20 DIAGNOSIS — G35D Multiple sclerosis, unspecified: Secondary | ICD-10-CM

## 2024-08-17 ENCOUNTER — Other Ambulatory Visit
